# Patient Record
Sex: Female | Born: 1970 | Race: White | Hispanic: No | Smoking: Former smoker
Health system: Southern US, Community
[De-identification: ages and names within clinical notes are randomized; demographics above are authoritative.]

## PROBLEM LIST (undated history)

## (undated) DIAGNOSIS — R109 Unspecified abdominal pain: Secondary | ICD-10-CM

## (undated) DIAGNOSIS — N926 Irregular menstruation, unspecified: Secondary | ICD-10-CM

## (undated) DIAGNOSIS — R87629 Unspecified abnormal cytological findings in specimens from vagina: Secondary | ICD-10-CM

## (undated) DIAGNOSIS — M1990 Unspecified osteoarthritis, unspecified site: Secondary | ICD-10-CM

## (undated) DIAGNOSIS — F329 Major depressive disorder, single episode, unspecified: Secondary | ICD-10-CM

## (undated) DIAGNOSIS — M722 Plantar fascial fibromatosis: Secondary | ICD-10-CM

## (undated) DIAGNOSIS — G4482 Headache associated with sexual activity: Principal | ICD-10-CM

## (undated) DIAGNOSIS — F32A Depression, unspecified: Secondary | ICD-10-CM

## (undated) DIAGNOSIS — M719 Bursopathy, unspecified: Secondary | ICD-10-CM

## (undated) DIAGNOSIS — G43909 Migraine, unspecified, not intractable, without status migrainosus: Secondary | ICD-10-CM

## (undated) DIAGNOSIS — Z8659 Personal history of other mental and behavioral disorders: Secondary | ICD-10-CM

## (undated) DIAGNOSIS — J329 Chronic sinusitis, unspecified: Secondary | ICD-10-CM

## (undated) DIAGNOSIS — M549 Dorsalgia, unspecified: Secondary | ICD-10-CM

## (undated) HISTORY — DX: Irregular menstruation, unspecified: N92.6

## (undated) HISTORY — DX: Unspecified abdominal pain: R10.9

## (undated) HISTORY — DX: Bursopathy, unspecified: M71.9

## (undated) HISTORY — DX: Unspecified osteoarthritis, unspecified site: M19.90

## (undated) HISTORY — DX: Plantar fascial fibromatosis: M72.2

## (undated) HISTORY — DX: Personal history of other mental and behavioral disorders: Z86.59

## (undated) HISTORY — DX: Chronic sinusitis, unspecified: J32.9

## (undated) HISTORY — DX: Dorsalgia, unspecified: M54.9

## (undated) HISTORY — DX: Headache associated with sexual activity: G44.82

## (undated) HISTORY — PX: OTHER SURGICAL HISTORY: SHX169

## (undated) HISTORY — DX: Unspecified abnormal cytological findings in specimens from vagina: R87.629

---

## 2000-07-30 ENCOUNTER — Emergency Department (HOSPITAL_COMMUNITY): Admission: EM | Admit: 2000-07-30 | Discharge: 2000-07-30 | Payer: Self-pay | Admitting: *Deleted

## 2000-10-28 ENCOUNTER — Other Ambulatory Visit: Admission: RE | Admit: 2000-10-28 | Discharge: 2000-10-28 | Payer: Self-pay | Admitting: Obstetrics and Gynecology

## 2001-08-27 ENCOUNTER — Other Ambulatory Visit: Admission: RE | Admit: 2001-08-27 | Discharge: 2001-08-27 | Payer: Self-pay | Admitting: Dermatology

## 2008-03-25 ENCOUNTER — Emergency Department (HOSPITAL_COMMUNITY): Admission: EM | Admit: 2008-03-25 | Discharge: 2008-03-25 | Payer: Self-pay | Admitting: Emergency Medicine

## 2008-06-07 ENCOUNTER — Emergency Department (HOSPITAL_COMMUNITY): Admission: EM | Admit: 2008-06-07 | Discharge: 2008-06-07 | Payer: Self-pay | Admitting: Emergency Medicine

## 2008-11-14 ENCOUNTER — Encounter
Admission: RE | Admit: 2008-11-14 | Discharge: 2008-11-14 | Payer: Self-pay | Admitting: Physical Medicine & Rehabilitation

## 2008-12-12 ENCOUNTER — Other Ambulatory Visit: Admission: RE | Admit: 2008-12-12 | Discharge: 2008-12-12 | Payer: Self-pay | Admitting: Obstetrics & Gynecology

## 2009-08-14 ENCOUNTER — Emergency Department (HOSPITAL_COMMUNITY): Admission: EM | Admit: 2009-08-14 | Discharge: 2009-08-15 | Payer: Self-pay | Admitting: Emergency Medicine

## 2009-12-02 ENCOUNTER — Emergency Department (HOSPITAL_COMMUNITY): Admission: EM | Admit: 2009-12-02 | Discharge: 2009-12-02 | Payer: Self-pay | Admitting: Emergency Medicine

## 2010-02-28 ENCOUNTER — Other Ambulatory Visit
Admission: RE | Admit: 2010-02-28 | Discharge: 2010-02-28 | Payer: Self-pay | Source: Home / Self Care | Admitting: Obstetrics & Gynecology

## 2010-06-18 LAB — URINALYSIS, ROUTINE W REFLEX MICROSCOPIC
Bilirubin Urine: NEGATIVE
Glucose, UA: NEGATIVE mg/dL
Hgb urine dipstick: NEGATIVE
Specific Gravity, Urine: 1.015 (ref 1.005–1.030)
Urobilinogen, UA: 0.2 mg/dL (ref 0.0–1.0)

## 2010-06-18 LAB — DIFFERENTIAL
Basophils Relative: 0 % (ref 0–1)
Eosinophils Absolute: 0 10*3/uL (ref 0.0–0.7)
Lymphs Abs: 0.8 10*3/uL (ref 0.7–4.0)
Monocytes Absolute: 0.2 10*3/uL (ref 0.1–1.0)
Monocytes Relative: 5 % (ref 3–12)
Neutro Abs: 3.9 10*3/uL (ref 1.7–7.7)

## 2010-06-18 LAB — BASIC METABOLIC PANEL
CO2: 23 mEq/L (ref 19–32)
Chloride: 115 mEq/L — ABNORMAL HIGH (ref 96–112)
GFR calc Af Amer: >60 mL/min (ref >60)
Sodium: 143 mEq/L (ref 135–145)

## 2010-06-18 LAB — CBC
Hemoglobin: 12.8 g/dL (ref 12.0–15.0)
MCHC: 36.4 g/dL — ABNORMAL HIGH (ref 30.0–36.0)
MCV: 89.9 fL (ref 78.0–100.0)
RBC: 3.91 MIL/uL (ref 3.87–5.11)
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
## 2011-04-17 ENCOUNTER — Other Ambulatory Visit (HOSPITAL_COMMUNITY)
Admission: RE | Admit: 2011-04-17 | Discharge: 2011-04-17 | Disposition: A | Discharge status: Home / Self Care | Payer: 59 | Source: Ambulatory Visit | Attending: Obstetrics and Gynecology | Admitting: Obstetrics and Gynecology

## 2011-04-17 ENCOUNTER — Other Ambulatory Visit: Payer: Self-pay | Admitting: Adult Health

## 2011-04-17 DIAGNOSIS — Z01419 Encounter for gynecological examination (general) (routine) without abnormal findings: Secondary | ICD-10-CM | POA: Insufficient documentation

## 2011-04-17 DIAGNOSIS — IMO0002 Reserved for concepts with insufficient information to code with codable children: Secondary | ICD-10-CM

## 2011-04-17 DIAGNOSIS — Z113 Encounter for screening for infections with a predominantly sexual mode of transmission: Secondary | ICD-10-CM | POA: Insufficient documentation

## 2011-04-18 ENCOUNTER — Other Ambulatory Visit: Payer: Self-pay | Admitting: Adult Health

## 2011-04-24 ENCOUNTER — Ambulatory Visit (HOSPITAL_COMMUNITY)
Admission: RE | Admit: 2011-04-24 | Discharge: 2011-04-24 | Disposition: A | Discharge status: Home / Self Care | Payer: 59 | Source: Ambulatory Visit | Attending: Adult Health | Admitting: Adult Health

## 2011-04-24 ENCOUNTER — Other Ambulatory Visit (HOSPITAL_COMMUNITY): Payer: Self-pay | Admitting: Adult Health

## 2011-04-24 DIAGNOSIS — IMO0002 Reserved for concepts with insufficient information to code with codable children: Secondary | ICD-10-CM

## 2011-04-24 DIAGNOSIS — N63 Unspecified lump in unspecified breast: Secondary | ICD-10-CM | POA: Insufficient documentation

## 2012-02-29 ENCOUNTER — Encounter (HOSPITAL_COMMUNITY): Payer: Self-pay | Admitting: Emergency Medicine

## 2012-02-29 ENCOUNTER — Emergency Department (HOSPITAL_COMMUNITY)
Admission: EM | Admit: 2012-02-29 | Discharge: 2012-02-29 | Disposition: A | Discharge status: Home / Self Care | Payer: 59 | Attending: Emergency Medicine | Admitting: Emergency Medicine

## 2012-02-29 DIAGNOSIS — G43909 Migraine, unspecified, not intractable, without status migrainosus: Secondary | ICD-10-CM | POA: Insufficient documentation

## 2012-02-29 DIAGNOSIS — R51 Headache: Secondary | ICD-10-CM

## 2012-02-29 DIAGNOSIS — F329 Major depressive disorder, single episode, unspecified: Secondary | ICD-10-CM | POA: Insufficient documentation

## 2012-02-29 DIAGNOSIS — F3289 Other specified depressive episodes: Secondary | ICD-10-CM | POA: Insufficient documentation

## 2012-02-29 HISTORY — DX: Depression, unspecified: F32.A

## 2012-02-29 HISTORY — DX: Migraine, unspecified, not intractable, without status migrainosus: G43.909

## 2012-02-29 HISTORY — DX: Major depressive disorder, single episode, unspecified: F32.9

## 2012-02-29 MED ORDER — METOCLOPRAMIDE HCL 5 MG/ML IJ SOLN
10.0000 mg | Freq: Once | INTRAMUSCULAR | Status: AC
  Administered 2012-02-29: 10 mg via INTRAMUSCULAR
  Filled 2012-02-29: qty 2

## 2012-02-29 MED ORDER — ONDANSETRON 4 MG PO TBDP: 4.0000 mg | ORAL_TABLET | Freq: Three times a day (TID) | ORAL | Status: DC | PRN

## 2012-02-29 MED ORDER — PROMETHAZINE HCL 25 MG/ML IJ SOLN
25.0000 mg | Freq: Once | INTRAMUSCULAR | Status: DC
  Filled 2012-02-29: qty 1

## 2012-02-29 MED ORDER — KETOROLAC TROMETHAMINE 60 MG/2ML IM SOLN
60.0000 mg | Freq: Once | INTRAMUSCULAR | Status: AC
  Administered 2012-02-29: 60 mg via INTRAMUSCULAR
  Filled 2012-02-29: qty 2

## 2012-02-29 MED ORDER — OXYCODONE-ACETAMINOPHEN 5-325 MG PO TABS: 1.0000 | ORAL_TABLET | ORAL | Status: DC | PRN

## 2012-02-29 MED ORDER — NAPROXEN 500 MG PO TABS: 500.0000 mg | ORAL_TABLET | Freq: Two times a day (BID) | ORAL | Status: DC

## 2012-02-29 MED ORDER — ONDANSETRON 8 MG PO TBDP
8.0000 mg | ORAL_TABLET | Freq: Once | ORAL | Status: AC
  Administered 2012-02-29: 8 mg via ORAL
  Filled 2012-02-29: qty 1

## 2012-02-29 NOTE — ED Notes (Signed)
Pt complains of migraine headache x3 hours, also complains of nausea and vomiting. States she has had 2 8mg  zofran tablets over the last 4 hours. Notes a history of migraines and states they are hormonal. Pt aax4 at this time.

## 2012-02-29 NOTE — ED Provider Notes (Signed)
History     CSN: [id number]  Arrival date & time 02/29/12  4098   First MD Initiated Contact with Patient 02/29/12 [phone]      Chief Complaint  Patient presents with  . Migraine    (Consider location/radiation/quality/duration/timing/severity/associated sxs/prior treatment) HPI Comments: 41 year old female with a history of migraine disorder, she has been formally diagnosed with this and is currently treated by her doctor, Dr. Margo Aye with Valium and ibuprofen which she states usually works for her migraines. Approximately 3 hours prior to arrival the patient developed left-sided throbbing headache which radiates over her left ear down to her left neck. This is similar to her prior headaches, she has associated nausea and vomiting similar to her prior headaches but denies any fevers, chills, cough, shortness of breath, chest pain, diarrhea, rashes, sore throat. She has had no improvement with the medications prior to arrival.  Patient is a 41 y.o. female presenting with migraines. The history is provided by the patient.  Migraine    Past Medical History  Diagnosis Date  . Migraine   . Depressed     History reviewed. No pertinent past surgical history.  History reviewed. No pertinent family history.  History  Substance Use Topics  . Smoking status: Never Smoker   . Smokeless tobacco: Not on file  . Alcohol Use: Yes     Comment: states occasional use    OB History    Grav Para Term Preterm Abortions TAB SAB Ect Mult Living                  Review of Systems  All other systems reviewed and are negative.    Allergies  Erythromycin  Home Medications   Current Outpatient Rx  Name  Route  Sig  Dispense  Refill  . ONDANSETRON HCL 4 MG PO TABS   Oral   Take 4 mg by mouth every 8 (eight) hours as needed.         Marland Kitchen NAPROXEN 500 MG PO TABS   Oral   Take 1 tablet (500 mg total) by mouth 2 (two) times daily with a meal.   30 tablet   0   . ONDANSETRON 4 MG PO  TBDP   Oral   Take 1 tablet (4 mg total) by mouth every 8 (eight) hours as needed for nausea.   10 tablet   0   . OXYCODONE-ACETAMINOPHEN 5-325 MG PO TABS   Oral   Take 1 tablet by mouth every 4 (four) hours as needed for pain.   20 tablet   0     BP 110/66  Pulse 80  Temp 98.6 F (37 C) (Oral)  Resp 20  Ht 5\' 3"  (1.6 m)  Wt 104 lb (47.174 kg)  BMI 18.42 kg/m2  SpO2 98%  LMP 02/27/2012  Physical Exam  Nursing note and vitals reviewed. Constitutional: She appears well-developed and well-nourished.       Uncomfortable appearing  HENT:  Head: Normocephalic and atraumatic.  Mouth/Throat: Oropharynx is clear and moist. No oropharyngeal exudate.  Eyes: Conjunctivae normal and EOM are normal. Pupils are equal, round, and reactive to light. Right eye exhibits no discharge. Left eye exhibits no discharge. No scleral icterus.  Neck: Normal range of motion. Neck supple. No JVD present. No thyromegaly present.  Cardiovascular: Normal rate, regular rhythm, normal heart sounds and intact distal pulses.  Exam reveals no gallop and no friction rub.   No murmur heard. Pulmonary/Chest: Effort normal and breath sounds normal.  No respiratory distress. She has no wheezes. She has no rales.  Abdominal: Soft. Bowel sounds are normal. She exhibits no distension and no mass. There is no tenderness.  Musculoskeletal: Normal range of motion. She exhibits no edema and no tenderness.  Lymphadenopathy:    She has no cervical adenopathy.  Neurological: She is alert. Coordination normal.       Normal speech, normal coordination of all 4 extremities, normal strength at the bilateral hands and grips, normal cranial nerves III through XII  Skin: Skin is warm and dry. No rash noted. No erythema.  Psychiatric: She has a normal mood and affect. Her behavior is normal.    ED Course  Procedures (including critical care time)  Labs Reviewed - No data to display No results found.   1. Headache        MDM  Overall the patient appears, she has normal vital signs, I will give intramuscular Toradol and metoclopramide, reevaluate.  Patient states that her headache is much better, still has mild nausea, Zofran given. The patient refused Phenergan.      Vida Roller, MD 02/29/12 [phone]

## 2012-02-29 NOTE — ED Notes (Signed)
Pt notes relief of headache but states she is still having nausea

## 2013-01-30 ENCOUNTER — Encounter (HOSPITAL_COMMUNITY): Payer: Self-pay | Admitting: Emergency Medicine

## 2013-01-30 ENCOUNTER — Emergency Department (HOSPITAL_COMMUNITY)
Admission: EM | Admit: 2013-01-30 | Discharge: 2013-01-30 | Disposition: A | Discharge status: Home / Self Care | Payer: BC Managed Care – PPO | Attending: Emergency Medicine | Admitting: Emergency Medicine

## 2013-01-30 DIAGNOSIS — F329 Major depressive disorder, single episode, unspecified: Secondary | ICD-10-CM | POA: Insufficient documentation

## 2013-01-30 DIAGNOSIS — Z79899 Other long term (current) drug therapy: Secondary | ICD-10-CM | POA: Insufficient documentation

## 2013-01-30 DIAGNOSIS — R519 Headache, unspecified: Secondary | ICD-10-CM

## 2013-01-30 DIAGNOSIS — Z881 Allergy status to other antibiotic agents status: Secondary | ICD-10-CM | POA: Insufficient documentation

## 2013-01-30 DIAGNOSIS — F3289 Other specified depressive episodes: Secondary | ICD-10-CM | POA: Insufficient documentation

## 2013-01-30 DIAGNOSIS — R110 Nausea: Secondary | ICD-10-CM | POA: Insufficient documentation

## 2013-01-30 DIAGNOSIS — Z8669 Personal history of other diseases of the nervous system and sense organs: Secondary | ICD-10-CM | POA: Insufficient documentation

## 2013-01-30 DIAGNOSIS — H53149 Visual discomfort, unspecified: Secondary | ICD-10-CM | POA: Insufficient documentation

## 2013-01-30 DIAGNOSIS — R51 Headache: Secondary | ICD-10-CM | POA: Insufficient documentation

## 2013-01-30 MED ORDER — DIPHENHYDRAMINE HCL 50 MG/ML IJ SOLN
25.0000 mg | Freq: Once | INTRAMUSCULAR | Status: DC
  Filled 2013-01-30: qty 1

## 2013-01-30 MED ORDER — SODIUM CHLORIDE 0.9 % IV BOLUS (SEPSIS)
1000.0000 mL | Freq: Once | INTRAVENOUS | Status: AC
  Administered 2013-01-30: 1000 mL via INTRAVENOUS

## 2013-01-30 MED ORDER — KETOROLAC TROMETHAMINE 30 MG/ML IJ SOLN
30.0000 mg | Freq: Once | INTRAMUSCULAR | Status: DC
  Filled 2013-01-30: qty 1

## 2013-01-30 MED ORDER — KETOROLAC TROMETHAMINE 15 MG/ML IJ SOLN
15.0000 mg | Freq: Once | INTRAMUSCULAR | Status: AC
  Administered 2013-01-30: 15 mg via INTRAVENOUS
  Filled 2013-01-30: qty 1

## 2013-01-30 MED ORDER — METOCLOPRAMIDE HCL 5 MG/ML IJ SOLN
10.0000 mg | Freq: Once | INTRAMUSCULAR | Status: AC
  Administered 2013-01-30: 10 mg via INTRAVENOUS
  Filled 2013-01-30: qty 2

## 2013-01-30 NOTE — ED Notes (Signed)
Patient discharged to home with family. NAD.  

## 2013-01-30 NOTE — ED Provider Notes (Signed)
CSN: [id number]     Arrival date & time 01/30/13  0503 History   First MD Initiated Contact with Patient 01/30/13 0537     Chief Complaint  Patient presents with  . Migraine   (Consider location/radiation/quality/duration/timing/severity/associated sxs/prior Treatment) HPI Comments: Patient presents to the ED with a chief complaint of headache.  She states that the headache started last night and has progressively worsened.  She has a history of migraines.  She states that they are typically hormonal.  She states that she is due to start her period.  She states that the pain worsened this morning.  She is normally able to control the headaches with tylenol, ibuprofen, valium, and zofran, but this morning they have not helped.  She endorses associated nausea, photophobia, and phonophobia.  She denies fevers, chills, or ataxia.  The history is provided by the patient. No language interpreter was used.    Past Medical History  Diagnosis Date  . Migraine   . Depressed    History reviewed. No pertinent past surgical history. No family history on file. History  Substance Use Topics  . Smoking status: Never Smoker   . Smokeless tobacco: Not on file  . Alcohol Use: Yes     Comment: states occasional use   OB History   Grav Para Term Preterm Abortions TAB SAB Ect Mult Living                 Review of Systems  All other systems reviewed and are negative.    Allergies  Erythromycin  Home Medications   Current Outpatient Rx  Name  Route  Sig  Dispense  Refill  . cefdinir (OMNICEF) 300 MG capsule   Oral   Take 300 mg by mouth 2 (two) times daily.         . diazepam (VALIUM) 5 MG tablet   Oral   Take 5 mg by mouth every 6 (six) hours as needed (pain due to muscle spasms).         . fluconazole (DIFLUCAN) 200 MG tablet   Oral   Take 200 mg by mouth once a week. For 2 doses only         . FLUoxetine (PROZAC) 10 MG capsule   Oral   Take 10 mg by mouth daily.          . Lactobacillus (ACIDOPHILUS PO)   Oral   Take 1 capsule by mouth daily.         . Multiple Vitamin (MULTIVITAMIN WITH MINERALS) TABS tablet   Oral   Take 1 tablet by mouth daily.         . ondansetron (ZOFRAN) 8 MG tablet   Oral   Take 8 mg by mouth every 8 (eight) hours as needed for nausea.          BP 114/83  Pulse 82  Temp(Src) 98.2 F (36.8 C) (Oral)  Resp 18  SpO2 100%  LMP 01/03/2013 Physical Exam  Nursing note and vitals reviewed. Constitutional: She is oriented to person, place, and time. She appears well-developed and well-nourished.  HENT:  Head: Normocephalic and atraumatic.  Right Ear: External ear normal.  Left Ear: External ear normal.  No increased pain with chewing.  Eyes: Conjunctivae and EOM are normal. Pupils are equal, round, and reactive to light.  Neck: Normal range of motion. Neck supple.  No pain with neck flexion, no meningismus  Cardiovascular: Normal rate, regular rhythm and normal heart sounds.  Exam reveals  no gallop and no friction rub.   No murmur heard. Pulmonary/Chest: Effort normal and breath sounds normal. No respiratory distress. She has no wheezes. She has no rales. She exhibits no tenderness.  Abdominal: Soft. Bowel sounds are normal. She exhibits no distension and no mass. There is no tenderness. There is no rebound and no guarding.  Musculoskeletal: Normal range of motion. She exhibits no edema and no tenderness.  Normal gait.  Neurological: She is alert and oriented to person, place, and time. She has normal reflexes.  CN 3-12 intact, no pronator drift, normal shin to heel, normal RAM, sensation and strength intact bilaterally.  Skin: Skin is warm and dry.  Psychiatric: She has a normal mood and affect. Her behavior is normal. Judgment and thought content normal.    ED Course  Procedures (including critical care time) Labs Review Labs Reviewed - No data to display Imaging Review No results found.  EKG Interpretation    None       MDM   1. Headache     Patient with headache.  States that this feels like her typical headache.  She has not had any fevers or neurovascular deficits.  Headache started last night and worsened this morning.  Not thunderclap in onset.  Progressively worsening.  Will give migraine cocktail and re-evaluate.  Patient asks for low-dose cocktail.  Patient feels significantly improved.  She is requesting to go home.  Patient is stable and ready for discharge.  Return precautions are given.    Roxy Horseman, PA-C 01/30/13 [phone]

## 2013-01-30 NOTE — ED Notes (Addendum)
Migraine: occipital region. Light sensitive. [id number]: took 8 mg odt and 5 mg valium with no relief. Did take ibuprofen throughout the day with no relief.

## 2013-01-30 NOTE — ED Provider Notes (Signed)
Medical screening examination/treatment/procedure(s) were performed by non-physician practitioner and as supervising physician I was immediately available for consultation/collaboration.   Nyana Haren, MD 01/30/13 2300 

## 2013-04-02 ENCOUNTER — Other Ambulatory Visit (HOSPITAL_COMMUNITY): Payer: Self-pay | Admitting: Internal Medicine

## 2013-04-02 DIAGNOSIS — K829 Disease of gallbladder, unspecified: Secondary | ICD-10-CM

## 2013-04-05 ENCOUNTER — Ambulatory Visit (HOSPITAL_COMMUNITY): Payer: BC Managed Care – PPO

## 2013-04-05 ENCOUNTER — Ambulatory Visit (HOSPITAL_COMMUNITY)
Admission: RE | Admit: 2013-04-05 | Discharge: 2013-04-05 | Disposition: A | Discharge status: Home / Self Care | Payer: BC Managed Care – PPO | Source: Ambulatory Visit | Attending: Internal Medicine | Admitting: Internal Medicine

## 2013-04-05 DIAGNOSIS — K829 Disease of gallbladder, unspecified: Secondary | ICD-10-CM

## 2013-04-05 DIAGNOSIS — R1011 Right upper quadrant pain: Secondary | ICD-10-CM | POA: Insufficient documentation

## 2013-09-27 ENCOUNTER — Other Ambulatory Visit: Payer: Self-pay | Admitting: Physical Medicine and Rehabilitation

## 2013-09-27 DIAGNOSIS — IMO0002 Reserved for concepts with insufficient information to code with codable children: Secondary | ICD-10-CM

## 2013-09-27 DIAGNOSIS — M5126 Other intervertebral disc displacement, lumbar region: Secondary | ICD-10-CM

## 2013-10-05 ENCOUNTER — Ambulatory Visit
Admission: RE | Admit: 2013-10-05 | Discharge: 2013-10-05 | Disposition: A | Discharge status: Home / Self Care | Payer: BC Managed Care – PPO | Source: Ambulatory Visit | Attending: Physical Medicine and Rehabilitation | Admitting: Physical Medicine and Rehabilitation

## 2013-10-05 DIAGNOSIS — IMO0002 Reserved for concepts with insufficient information to code with codable children: Secondary | ICD-10-CM

## 2013-10-05 DIAGNOSIS — M5126 Other intervertebral disc displacement, lumbar region: Secondary | ICD-10-CM

## 2013-11-19 ENCOUNTER — Ambulatory Visit (INDEPENDENT_AMBULATORY_CARE_PROVIDER_SITE_OTHER): Payer: Self-pay

## 2013-11-19 ENCOUNTER — Ambulatory Visit (INDEPENDENT_AMBULATORY_CARE_PROVIDER_SITE_OTHER): Payer: BC Managed Care – PPO | Admitting: Neurology

## 2013-11-19 DIAGNOSIS — M545 Low back pain, unspecified: Secondary | ICD-10-CM

## 2013-11-19 DIAGNOSIS — Z0289 Encounter for other administrative examinations: Secondary | ICD-10-CM

## 2013-11-19 DIAGNOSIS — R209 Unspecified disturbances of skin sensation: Secondary | ICD-10-CM

## 2013-11-19 NOTE — Procedures (Signed)
   NCS (NERVE CONDUCTION STUDY) WITH EMG (ELECTROMYOGRAPHY) REPORT   STUDY DATE: August 20 first 2015 PATIENT NAME: [place] Rybicki DOB: 15-Feb-1971 MRN: [id number]    TECHNOLOGIST: Gearldine ShownLorraine Jones ELECTROMYOGRAPHER: Levert FeinsteinYan, Ceaser Ebeling M.D.  CLINICAL INFORMATION:  43 years old female, fell down steps, complains of left low back pain, lower extremity paresthesia  On examination: Bilateral lower extremity motor strength is normal, sensory was intact to light touch, deep tendon reflexes of both upper and lower extremities were brisk and symmetric. Plantar responses were flexor   FINDINGS: NERVE CONDUCTION STUDY: Bilateral peroneal sensory responses were normal. Bilateral peroneal, tibial motor responses were normal. Bilateral H. reflexes were normal and symmetric.  NEEDLE ELECTROMYOGRAPHY: Selected needle examination was performed at left lower extremity muscles, and left lumbosacral paraspinal muscles.  Needle examination of left tibialis anterior, tibialis posterior, peroneal longus, vastus lateralis, biceps femoris short head was normal  There was no spontaneous activity at left lumbosacral paraspinal muscles, left L4 L5-S1  IMPRESSION:  This is a normal study. There is no electrodiagnostic evidence of large fiber peripheral neuropathy, or left lumbar radiculopathy.  INTERPRETING PHYSICIAN:   Levert FeinsteinYan, Cresencia Asmus M.D. Ph.D. [facility] [address], [address] [place], [place] [address] [phone]

## 2013-12-15 ENCOUNTER — Other Ambulatory Visit: Payer: BC Managed Care – PPO | Admitting: Adult Health

## 2013-12-22 ENCOUNTER — Ambulatory Visit (INDEPENDENT_AMBULATORY_CARE_PROVIDER_SITE_OTHER): Payer: BC Managed Care – PPO | Admitting: Adult Health

## 2013-12-22 ENCOUNTER — Encounter: Payer: Self-pay | Admitting: Adult Health

## 2013-12-22 ENCOUNTER — Other Ambulatory Visit (HOSPITAL_COMMUNITY)
Admission: RE | Admit: 2013-12-22 | Discharge: 2013-12-22 | Disposition: A | Discharge status: Home / Self Care | Payer: BC Managed Care – PPO | Source: Ambulatory Visit | Attending: Adult Health | Admitting: Adult Health

## 2013-12-22 VITALS — BP 104/80 | HR 76 | Ht 63.0 in | Wt 105.5 lb

## 2013-12-22 DIAGNOSIS — Z01419 Encounter for gynecological examination (general) (routine) without abnormal findings: Secondary | ICD-10-CM | POA: Diagnosis not present

## 2013-12-22 DIAGNOSIS — M545 Low back pain: Secondary | ICD-10-CM

## 2013-12-22 DIAGNOSIS — Z1151 Encounter for screening for human papillomavirus (HPV): Secondary | ICD-10-CM | POA: Diagnosis present

## 2013-12-22 DIAGNOSIS — M549 Dorsalgia, unspecified: Secondary | ICD-10-CM | POA: Insufficient documentation

## 2013-12-22 NOTE — Patient Instructions (Signed)
Physical in 1 year Mammogram yearly  

## 2013-12-22 NOTE — Progress Notes (Signed)
Patient ID: Carly Baker, female   DOB: 07/14/1970, 43 y.o.   MRN: [id number] History of Present Illness: Carly is a 43 year old white female in for a pap and physical.She noticed a discharge last week but it has cleared up and she has irregular periods at times with BTB that has occurred for years.She fell 07/25/13 and has had pain in low left back since and has seen Dr Ladona Ridgel and Dr Eduard Clos and Dr Cleophas Dunker and is going to [facility] next.Has had MRI and injections and TENS unit and still has pain and pain down leg.   Current Medications, Allergies, Past Medical History, Past Surgical History, Family History and Social History were reviewed in [employer] record.     Review of Systems: Patient denies any headaches, blurred vision, shortness of breath, chest pain, abdominal pain, problems with urination, or intercourse. No joint pain or mood swings,using miralax for OIC, see HPI.    Physical Exam:BP 104/80  Pulse 76  Ht  (1.6 m)  Wt 105 lb 8 oz (47.854 kg)  BMI 18.69 kg/m2  LMP 12/15/2013 General:  Well developed, well nourished, no acute distress Skin:  Warm and dry Neck:  Midline trachea, normal thyroid Lungs; Clear to auscultation bilaterally Breast:  No dominant palpable mass, retraction, or nipple discharge Cardiovascular: Regular rate and rhythm Abdomen:  Soft, non tender, no hepatosplenomegaly Pelvic:  External genitalia is normal in appearance.  The vagina is normal in appearance.   The cervix is smooth friable with EC brush, pap with HPV performed.  Uterus is felt to be normal size, shape, and contour.  No  adnexal masses or tenderness noted. Rectal: Good sphincter tone, no polyps, or hemorrhoids felt.  Hemoccult negative. Extremities:  No swelling or varicosities noted Psych:  No mood changes, alert and cooperative seems happy but concerned   Impression: Yearly gyn exam  Low back pain with sciatica     Plan: Physical in 1 year Mammogram now and  yearly Keep appt at [facility]

## 2013-12-24 ENCOUNTER — Telehealth: Payer: Self-pay | Admitting: Adult Health

## 2013-12-24 LAB — CYTOLOGY - PAP

## 2013-12-24 MED ORDER — NITROFURANTOIN MONOHYD MACRO 100 MG PO CAPS: 100.0000 mg | ORAL_CAPSULE | Freq: Two times a day (BID) | ORAL | Status: DC

## 2013-12-24 NOTE — Telephone Encounter (Signed)
Called comes of frequency and burning with urination started yesterday taking AZO can't come in today, will rx Macrobid and push fluids if not better by Monday call.

## 2013-12-27 ENCOUNTER — Telehealth: Payer: Self-pay | Admitting: Adult Health

## 2013-12-27 NOTE — Telephone Encounter (Signed)
Try taking with food or milk make appt with PCP to recheck urine

## 2014-01-31 ENCOUNTER — Encounter: Payer: Self-pay | Admitting: Adult Health

## 2014-11-07 ENCOUNTER — Ambulatory Visit (INDEPENDENT_AMBULATORY_CARE_PROVIDER_SITE_OTHER): Payer: BLUE CROSS/BLUE SHIELD | Admitting: Adult Health

## 2014-11-07 ENCOUNTER — Encounter: Payer: Self-pay | Admitting: Adult Health

## 2014-11-07 VITALS — BP 120/80 | HR 72 | Ht 63.0 in | Wt 120.0 lb

## 2014-11-07 DIAGNOSIS — R109 Unspecified abdominal pain: Secondary | ICD-10-CM | POA: Diagnosis not present

## 2014-11-07 DIAGNOSIS — J329 Chronic sinusitis, unspecified: Secondary | ICD-10-CM

## 2014-11-07 DIAGNOSIS — N926 Irregular menstruation, unspecified: Secondary | ICD-10-CM | POA: Diagnosis not present

## 2014-11-07 DIAGNOSIS — Z8659 Personal history of other mental and behavioral disorders: Secondary | ICD-10-CM

## 2014-11-07 DIAGNOSIS — J0121 Acute recurrent ethmoidal sinusitis: Secondary | ICD-10-CM

## 2014-11-07 DIAGNOSIS — Z3202 Encounter for pregnancy test, result negative: Secondary | ICD-10-CM | POA: Diagnosis not present

## 2014-11-07 DIAGNOSIS — G4482 Headache associated with sexual activity: Secondary | ICD-10-CM

## 2014-11-07 HISTORY — DX: Unspecified abdominal pain: R10.9

## 2014-11-07 HISTORY — DX: Personal history of other mental and behavioral disorders: Z86.59

## 2014-11-07 HISTORY — DX: Headache associated with sexual activity: G44.82

## 2014-11-07 HISTORY — DX: Irregular menstruation, unspecified: N92.6

## 2014-11-07 HISTORY — DX: Chronic sinusitis, unspecified: J32.9

## 2014-11-07 LAB — POCT URINALYSIS DIPSTICK
Glucose, UA: NEGATIVE
LEUKOCYTES UA: NEGATIVE
NITRITE UA: NEGATIVE
Protein, UA: NEGATIVE
RBC UA: NEGATIVE

## 2014-11-07 LAB — POCT URINE PREGNANCY: Preg Test, Ur: NEGATIVE

## 2014-11-07 MED ORDER — FLUCONAZOLE 150 MG PO TABS: 150.0000 mg | ORAL_TABLET | Freq: Once | ORAL | Status: DC

## 2014-11-07 MED ORDER — CEPHALEXIN 500 MG PO CAPS: 500.0000 mg | ORAL_CAPSULE | Freq: Four times a day (QID) | ORAL | Status: DC

## 2014-11-07 NOTE — Addendum Note (Signed)
Addended by: Cyril Mourning A on: 11/07/2014 03:53 PM   Modules accepted: Orders

## 2014-11-07 NOTE — Addendum Note (Signed)
Addended by: Cyril Mourning A on: 11/07/2014 01:02 PM   Modules accepted: Orders

## 2014-11-07 NOTE — Patient Instructions (Addendum)
Get MRI 8/18 at 4:45 at [facility] Take keflex See me 8/19 in f/u

## 2014-11-07 NOTE — Progress Notes (Addendum)
Subjective:     Patient ID: Carly Baker, female   DOB: Jun 27, 1970, 44 y.o.   MRN: [id number]  HPI [place] is a 44 year old white female in complaining of having missed 3 periods and ?sinus infection and has headache with orgasm for about 6 months(has happened about 3 times), and stomach cramps after sex.She has had hot flashes and moodiness at times and has panic attacks.She has some dizziness at times and blurred vision occasionally.  Review of Systems Patient denies any  hearing loss, fatigue, blurred vision, shortness of breath, chest pain, problems with bowel movements, urination.  No joint pain, see HPI for positives.  Reviewed past medical,surgical, social and family history. Reviewed medications and allergies.     Objective:   Physical Exam BP 120/80 mmHg  Pulse 72  Ht  (1.6 m)  Wt 120 lb (54.432 kg)  BMI 21.26 kg/m2 UPT negative,urine dipstick negative, Skin warm and dry, has tenderness in ethmoid sinus esp on left, no swollen lymph nodes and CN 2-12 intact, negative Romberg and can walk heel to toe. Pelvic: external genitalia is normal in appearance no lesions, vagina: scant discharge without odor,urethra has no lesions or masses noted, cervix:everted  and bulbous, uterus: normal size, shape and contour, non tender, no masses felt, adnexa: no masses or tenderness noted. Bladder is non tender and no masses felt.   She has valium for panic attacks, will treat sinus infection and get labs and will get MRI and MRA to assess headaches with orgasms and to rule out a hemorrhage or aneurysm.Pt agrees.  Assessment:     Headache with orgasm Missed periods Ethmoid sinus infection Abdominal cramps History of panic attacks    Plan:    Scheduled MRI of brain wo contrast and MRA wo contrast at [facility] 8/18 at 4:45 pm, Angie to precert Check CBC,CMP,TSH and [facility] Rx keflex 500 mg 1 qid x 7 days  Rx diflucan 150 mg #1 with 1 refill Follow up 8/19 with me to go over MRI/MRA and labs  May get gyn [place] if cramps continue   Insurance would not precert MRI of brain but would precert MRA, so will get MRA first, precert # [id number].

## 2014-11-08 ENCOUNTER — Telehealth: Payer: Self-pay | Admitting: Adult Health

## 2014-11-08 LAB — CBC
Hematocrit: 46.6 % (ref 34.0–46.6)
Hemoglobin: 16.1 g/dL — ABNORMAL HIGH (ref 11.1–15.9)
MCH: 29.6 pg (ref 26.6–33.0)
MCHC: 34.5 g/dL (ref 31.5–35.7)
MCV: 86 fL (ref 79–97)
Platelets: 235 10*3/uL (ref 150–379)
RBC: 5.44 x10E6/uL — ABNORMAL HIGH (ref 3.77–5.28)
RDW: 14.2 % (ref 12.3–15.4)
WBC: 8 10*3/uL (ref 3.4–10.8)

## 2014-11-08 LAB — COMPREHENSIVE METABOLIC PANEL
ALK PHOS: 61 IU/L (ref 39–117)
ALT: 24 IU/L (ref 0–32)
AST: 24 IU/L (ref 0–40)
Albumin/Globulin Ratio: 1.8 (ref 1.1–2.5)
Albumin: 4.9 g/dL (ref 3.5–5.5)
BUN / CREAT RATIO: 8 — AB (ref 9–23)
BUN: 8 mg/dL (ref 6–24)
Bilirubin Total: 0.4 mg/dL (ref 0.0–1.2)
CALCIUM: 10.1 mg/dL (ref 8.7–10.2)
CO2: 23 mmol/L (ref 18–29)
Chloride: 97 mmol/L (ref 97–108)
Creatinine, Ser: 1.02 mg/dL — ABNORMAL HIGH (ref 0.57–1.00)
GFR, EST AFRICAN AMERICAN: 77 mL/min/{1.73_m2} (ref >59)
GFR, EST NON AFRICAN AMERICAN: 67 mL/min/{1.73_m2} (ref >59)
GLOBULIN, TOTAL: 2.8 g/dL (ref 1.5–4.5)
GLUCOSE: 89 mg/dL (ref 65–99)
Potassium: 4.3 mmol/L (ref 3.5–5.2)
Sodium: 139 mmol/L (ref 134–144)
TOTAL PROTEIN: 7.7 g/dL (ref 6.0–8.5)

## 2014-11-08 LAB — FOLLICLE STIMULATING HORMONE: FSH: 8.9 m[IU]/mL

## 2014-11-08 LAB — TSH: TSH: 0.958 u[IU]/mL (ref 0.450–4.500)

## 2014-11-08 NOTE — Telephone Encounter (Signed)
Had question about no period, told her that could wait will address again at visit 8/19

## 2014-11-08 NOTE — Telephone Encounter (Signed)
Pt aware of labs  

## 2014-11-17 ENCOUNTER — Ambulatory Visit (HOSPITAL_COMMUNITY): Payer: BLUE CROSS/BLUE SHIELD

## 2014-11-17 ENCOUNTER — Other Ambulatory Visit (HOSPITAL_COMMUNITY): Payer: Self-pay

## 2014-11-18 ENCOUNTER — Ambulatory Visit: Payer: BLUE CROSS/BLUE SHIELD | Admitting: Adult Health

## 2014-11-23 ENCOUNTER — Telehealth: Payer: Self-pay | Admitting: Adult Health

## 2014-11-23 NOTE — Telephone Encounter (Signed)
Called pt about MRA she did not go get 8/18, due to money issues, she said they wanted $725 up front,she will try to get when has the money, she still has not had a period, will come in 8/30 for appt to start period

## 2014-11-29 ENCOUNTER — Encounter: Payer: Self-pay | Admitting: Adult Health

## 2014-11-29 ENCOUNTER — Ambulatory Visit (INDEPENDENT_AMBULATORY_CARE_PROVIDER_SITE_OTHER): Payer: BLUE CROSS/BLUE SHIELD | Admitting: Adult Health

## 2014-11-29 VITALS — BP 120/70 | HR 96 | Ht 63.0 in | Wt 120.0 lb

## 2014-11-29 DIAGNOSIS — N926 Irregular menstruation, unspecified: Secondary | ICD-10-CM | POA: Diagnosis not present

## 2014-11-29 DIAGNOSIS — Z3202 Encounter for pregnancy test, result negative: Secondary | ICD-10-CM | POA: Diagnosis not present

## 2014-11-29 DIAGNOSIS — N912 Amenorrhea, unspecified: Secondary | ICD-10-CM

## 2014-11-29 LAB — POCT URINE PREGNANCY: PREG TEST UR: NEGATIVE

## 2014-11-29 MED ORDER — MEDROXYPROGESTERONE ACETATE 10 MG PO TABS: 10.0000 mg | ORAL_TABLET | Freq: Every day | ORAL | Status: DC

## 2014-11-29 NOTE — Progress Notes (Signed)
Subjective:     Patient ID: Carly Baker, female   DOB: 1970-08-26, 44 y.o.   MRN: [id number]  HPI Carly is a 44 year old white female, has not had period since April.She is working now and is happy.Did not get MRA for headache with orgasm, due to costs,she says she will be can not afford it now.  Review of Systems  No period since April, Patient denies any hearing loss, fatigue, blurred vision, shortness of breath, chest pain, abdominal pain, problems with bowel movements, urination, or intercourse. No joint pain or mood swings.see HPI. Reviewed past medical,surgical, social and family history. Reviewed medications and allergies.     Objective:   Physical Exam BP 120/70 mmHg  Pulse 96  Ht  (1.6 m)  Wt 120 lb (54.432 kg)  BMI 21.26 kg/m2  LMP 07/10/2014 UPT negative, head is better since keflex, did not get MRA due to cost, will try provera to see if can start a period.face time 10 minutes, with 50 % counseling.If has cramps with bleeding, call but she says she has some tramadol.    Assessment:    Missed periods    Plan:     Rx provera 10 mg #10 take 1 daily for 10 days Call me with period

## 2014-11-29 NOTE — Patient Instructions (Signed)
Call with period  Take provera 10 mg 1 daily for 10 days

## 2014-12-14 ENCOUNTER — Telehealth: Payer: Self-pay | Admitting: Adult Health

## 2014-12-14 NOTE — Telephone Encounter (Signed)
Took last provera Friday and has still not started period yet, give it a few more days

## 2014-12-20 ENCOUNTER — Telehealth: Payer: Self-pay | Admitting: Adult Health

## 2014-12-20 NOTE — Telephone Encounter (Signed)
Spoke with pt. Pt states she has not started period. She took the last Provera on 12/09/14. Please advise. Thanks!! JSY

## 2014-12-21 MED ORDER — MEDROXYPROGESTERONE ACETATE 10 MG PO TABS: ORAL_TABLET | ORAL | Status: DC

## 2014-12-21 NOTE — Addendum Note (Signed)
Addended by: Cyril Mourning A on: 12/21/2014 04:00 PM   Modules accepted: Orders

## 2014-12-21 NOTE — Telephone Encounter (Signed)
Will cycle with provera 10 mg for 10  Days every 3 months, discussed with Dr Despina Hidden

## 2014-12-21 NOTE — Telephone Encounter (Signed)
Left message to call.

## 2016-02-05 DIAGNOSIS — Z79899 Other long term (current) drug therapy: Secondary | ICD-10-CM | POA: Diagnosis not present

## 2016-02-05 DIAGNOSIS — M25559 Pain in unspecified hip: Secondary | ICD-10-CM | POA: Diagnosis not present

## 2016-02-05 DIAGNOSIS — M545 Low back pain: Secondary | ICD-10-CM | POA: Diagnosis not present

## 2016-02-05 DIAGNOSIS — R2231 Localized swelling, mass and lump, right upper limb: Secondary | ICD-10-CM | POA: Diagnosis not present

## 2016-02-05 DIAGNOSIS — M461 Sacroiliitis, not elsewhere classified: Secondary | ICD-10-CM | POA: Diagnosis not present

## 2016-02-05 DIAGNOSIS — G43009 Migraine without aura, not intractable, without status migrainosus: Secondary | ICD-10-CM | POA: Diagnosis not present

## 2016-02-05 DIAGNOSIS — G894 Chronic pain syndrome: Secondary | ICD-10-CM | POA: Diagnosis not present

## 2016-03-06 DIAGNOSIS — M545 Low back pain: Secondary | ICD-10-CM | POA: Diagnosis not present

## 2016-03-06 DIAGNOSIS — F411 Generalized anxiety disorder: Secondary | ICD-10-CM | POA: Diagnosis not present

## 2016-04-08 DIAGNOSIS — G894 Chronic pain syndrome: Secondary | ICD-10-CM | POA: Diagnosis not present

## 2016-04-08 DIAGNOSIS — K588 Other irritable bowel syndrome: Secondary | ICD-10-CM | POA: Diagnosis not present

## 2016-04-08 DIAGNOSIS — K219 Gastro-esophageal reflux disease without esophagitis: Secondary | ICD-10-CM | POA: Diagnosis not present

## 2016-04-08 DIAGNOSIS — G43009 Migraine without aura, not intractable, without status migrainosus: Secondary | ICD-10-CM | POA: Diagnosis not present

## 2016-05-06 DIAGNOSIS — K589 Irritable bowel syndrome without diarrhea: Secondary | ICD-10-CM | POA: Diagnosis not present

## 2016-05-06 DIAGNOSIS — G894 Chronic pain syndrome: Secondary | ICD-10-CM | POA: Diagnosis not present

## 2016-05-06 DIAGNOSIS — G43009 Migraine without aura, not intractable, without status migrainosus: Secondary | ICD-10-CM | POA: Diagnosis not present

## 2016-05-06 DIAGNOSIS — M62830 Muscle spasm of back: Secondary | ICD-10-CM | POA: Diagnosis not present

## 2016-07-26 DIAGNOSIS — H5213 Myopia, bilateral: Secondary | ICD-10-CM | POA: Diagnosis not present

## 2016-08-21 DIAGNOSIS — N926 Irregular menstruation, unspecified: Secondary | ICD-10-CM | POA: Diagnosis not present

## 2016-08-21 DIAGNOSIS — M545 Low back pain: Secondary | ICD-10-CM | POA: Diagnosis not present

## 2016-08-21 DIAGNOSIS — Z0000 Encounter for general adult medical examination without abnormal findings: Secondary | ICD-10-CM | POA: Diagnosis not present

## 2016-08-21 DIAGNOSIS — K219 Gastro-esophageal reflux disease without esophagitis: Secondary | ICD-10-CM | POA: Diagnosis not present

## 2016-08-21 DIAGNOSIS — G43009 Migraine without aura, not intractable, without status migrainosus: Secondary | ICD-10-CM | POA: Diagnosis not present

## 2016-11-18 DIAGNOSIS — G43009 Migraine without aura, not intractable, without status migrainosus: Secondary | ICD-10-CM | POA: Diagnosis not present

## 2016-11-18 DIAGNOSIS — F411 Generalized anxiety disorder: Secondary | ICD-10-CM | POA: Diagnosis not present

## 2016-11-18 DIAGNOSIS — G894 Chronic pain syndrome: Secondary | ICD-10-CM | POA: Diagnosis not present

## 2016-11-18 DIAGNOSIS — N926 Irregular menstruation, unspecified: Secondary | ICD-10-CM | POA: Diagnosis not present

## 2017-01-14 DIAGNOSIS — Z0000 Encounter for general adult medical examination without abnormal findings: Secondary | ICD-10-CM | POA: Diagnosis not present

## 2017-01-24 DIAGNOSIS — F419 Anxiety disorder, unspecified: Secondary | ICD-10-CM | POA: Diagnosis not present

## 2017-01-24 DIAGNOSIS — K589 Irritable bowel syndrome without diarrhea: Secondary | ICD-10-CM | POA: Diagnosis not present

## 2017-01-24 DIAGNOSIS — Z0000 Encounter for general adult medical examination without abnormal findings: Secondary | ICD-10-CM | POA: Diagnosis not present

## 2017-01-24 DIAGNOSIS — G8929 Other chronic pain: Secondary | ICD-10-CM | POA: Diagnosis not present

## 2017-02-19 DIAGNOSIS — G894 Chronic pain syndrome: Secondary | ICD-10-CM | POA: Diagnosis not present

## 2017-04-02 DIAGNOSIS — S336XXA Sprain of sacroiliac joint, initial encounter: Secondary | ICD-10-CM | POA: Diagnosis not present

## 2017-04-02 DIAGNOSIS — M9903 Segmental and somatic dysfunction of lumbar region: Secondary | ICD-10-CM | POA: Diagnosis not present

## 2017-04-02 DIAGNOSIS — M9904 Segmental and somatic dysfunction of sacral region: Secondary | ICD-10-CM | POA: Diagnosis not present

## 2017-04-02 DIAGNOSIS — M4727 Other spondylosis with radiculopathy, lumbosacral region: Secondary | ICD-10-CM | POA: Diagnosis not present

## 2017-04-03 DIAGNOSIS — S336XXA Sprain of sacroiliac joint, initial encounter: Secondary | ICD-10-CM | POA: Diagnosis not present

## 2017-04-03 DIAGNOSIS — M9904 Segmental and somatic dysfunction of sacral region: Secondary | ICD-10-CM | POA: Diagnosis not present

## 2017-04-03 DIAGNOSIS — M4727 Other spondylosis with radiculopathy, lumbosacral region: Secondary | ICD-10-CM | POA: Diagnosis not present

## 2017-04-03 DIAGNOSIS — M9903 Segmental and somatic dysfunction of lumbar region: Secondary | ICD-10-CM | POA: Diagnosis not present

## 2017-04-07 DIAGNOSIS — J019 Acute sinusitis, unspecified: Secondary | ICD-10-CM | POA: Diagnosis not present

## 2017-05-08 ENCOUNTER — Telehealth: Payer: Self-pay | Admitting: *Deleted

## 2017-05-08 NOTE — Telephone Encounter (Signed)
Pt called stating that she has bled through 3 tampons in the last hour and that she passed a blood clot the size of a tampon. She is very concerned about the amount of bleeding that she is having. Advised pt that she should be evaluated and since our office is closing she should go to her nearest urgent care or ED. Pt verbalized understanding.

## 2017-05-19 DIAGNOSIS — G894 Chronic pain syndrome: Secondary | ICD-10-CM | POA: Diagnosis not present

## 2017-06-12 DIAGNOSIS — E782 Mixed hyperlipidemia: Secondary | ICD-10-CM | POA: Diagnosis not present

## 2017-06-12 DIAGNOSIS — I10 Essential (primary) hypertension: Secondary | ICD-10-CM | POA: Diagnosis not present

## 2017-06-12 DIAGNOSIS — E119 Type 2 diabetes mellitus without complications: Secondary | ICD-10-CM | POA: Diagnosis not present

## 2017-06-12 DIAGNOSIS — E039 Hypothyroidism, unspecified: Secondary | ICD-10-CM | POA: Diagnosis not present

## 2017-06-12 DIAGNOSIS — E559 Vitamin D deficiency, unspecified: Secondary | ICD-10-CM | POA: Diagnosis not present

## 2017-06-16 DIAGNOSIS — M545 Low back pain: Secondary | ICD-10-CM | POA: Diagnosis not present

## 2017-06-16 DIAGNOSIS — E782 Mixed hyperlipidemia: Secondary | ICD-10-CM | POA: Diagnosis not present

## 2017-06-16 DIAGNOSIS — Z0000 Encounter for general adult medical examination without abnormal findings: Secondary | ICD-10-CM | POA: Diagnosis not present

## 2017-06-16 DIAGNOSIS — G894 Chronic pain syndrome: Secondary | ICD-10-CM | POA: Diagnosis not present

## 2017-06-20 ENCOUNTER — Other Ambulatory Visit (HOSPITAL_COMMUNITY): Payer: Self-pay | Admitting: Internal Medicine

## 2017-06-20 DIAGNOSIS — R229 Localized swelling, mass and lump, unspecified: Principal | ICD-10-CM

## 2017-06-20 DIAGNOSIS — IMO0002 Reserved for concepts with insufficient information to code with codable children: Secondary | ICD-10-CM

## 2017-07-01 ENCOUNTER — Ambulatory Visit (HOSPITAL_COMMUNITY)
Admission: RE | Admit: 2017-07-01 | Discharge: 2017-07-01 | Disposition: A | Discharge status: Home / Self Care | Payer: BLUE CROSS/BLUE SHIELD | Source: Ambulatory Visit | Attending: Internal Medicine | Admitting: Internal Medicine

## 2017-07-01 ENCOUNTER — Encounter (HOSPITAL_COMMUNITY): Payer: BLUE CROSS/BLUE SHIELD

## 2017-07-01 DIAGNOSIS — R928 Other abnormal and inconclusive findings on diagnostic imaging of breast: Secondary | ICD-10-CM | POA: Diagnosis not present

## 2017-07-01 DIAGNOSIS — R229 Localized swelling, mass and lump, unspecified: Secondary | ICD-10-CM

## 2017-07-01 DIAGNOSIS — R922 Inconclusive mammogram: Secondary | ICD-10-CM | POA: Diagnosis not present

## 2017-07-01 DIAGNOSIS — IMO0002 Reserved for concepts with insufficient information to code with codable children: Secondary | ICD-10-CM

## 2017-07-01 DIAGNOSIS — N6320 Unspecified lump in the left breast, unspecified quadrant: Secondary | ICD-10-CM | POA: Diagnosis not present

## 2017-09-17 DIAGNOSIS — M25559 Pain in unspecified hip: Secondary | ICD-10-CM | POA: Diagnosis not present

## 2017-09-17 DIAGNOSIS — M545 Low back pain: Secondary | ICD-10-CM | POA: Diagnosis not present

## 2017-09-17 DIAGNOSIS — F419 Anxiety disorder, unspecified: Secondary | ICD-10-CM | POA: Diagnosis not present

## 2017-09-17 DIAGNOSIS — G894 Chronic pain syndrome: Secondary | ICD-10-CM | POA: Diagnosis not present

## 2017-10-06 ENCOUNTER — Encounter: Payer: Self-pay | Admitting: Adult Health

## 2017-10-06 ENCOUNTER — Ambulatory Visit: Payer: BLUE CROSS/BLUE SHIELD | Admitting: Adult Health

## 2017-10-06 VITALS — BP 120/68 | HR 69 | Ht 63.0 in | Wt 118.0 lb

## 2017-10-06 DIAGNOSIS — N920 Excessive and frequent menstruation with regular cycle: Secondary | ICD-10-CM

## 2017-10-06 DIAGNOSIS — B9689 Other specified bacterial agents as the cause of diseases classified elsewhere: Secondary | ICD-10-CM | POA: Diagnosis not present

## 2017-10-06 DIAGNOSIS — N760 Acute vaginitis: Secondary | ICD-10-CM

## 2017-10-06 DIAGNOSIS — N898 Other specified noninflammatory disorders of vagina: Secondary | ICD-10-CM

## 2017-10-06 LAB — POCT WET PREP (WET MOUNT): Clue Cells Wet Prep Whiff POC: NEGATIVE

## 2017-10-06 MED ORDER — METRONIDAZOLE 500 MG PO TABS: 500.0000 mg | ORAL_TABLET | Freq: Two times a day (BID) | ORAL | 0 refills | Status: DC

## 2017-10-06 NOTE — Patient Instructions (Signed)
Bacterial Vaginosis Bacterial vaginosis is a vaginal infection that occurs when the normal balance of bacteria in the vagina is disrupted. It results from an overgrowth of certain bacteria. This is the most common vaginal infection among women ages 15-44. Because bacterial vaginosis increases your risk for STIs (sexually transmitted infections), getting treated can help reduce your risk for chlamydia, gonorrhea, herpes, and HIV (human immunodeficiency virus). Treatment is also important for preventing complications in pregnant women, because this condition can cause an early (premature) delivery. What are the causes? This condition is caused by an increase in harmful bacteria that are normally present in small amounts in the vagina. However, the reason that the condition develops is not fully understood. What increases the risk? The following factors may make you more likely to develop this condition:  Having a new sexual partner or multiple sexual partners.  Having unprotected sex.  Douching.  Having an intrauterine device (IUD).  Smoking.  Drug and alcohol abuse.  Taking certain antibiotic medicines.  Being pregnant.  You cannot get bacterial vaginosis from toilet seats, bedding, swimming pools, or contact with objects around you. What are the signs or symptoms? Symptoms of this condition include:  Grey or white vaginal discharge. The discharge can also be watery or foamy.  A fish-like odor with discharge, especially after sexual intercourse or during menstruation.  Itching in and around the vagina.  Burning or pain with urination.  Some women with bacterial vaginosis have no signs or symptoms. How is this diagnosed? This condition is diagnosed based on:  Your medical history.  A physical exam of the vagina.  Testing a sample of vaginal fluid under a microscope to look for a large amount of bad bacteria or abnormal cells. Your health care provider may use a cotton swab  or a small wooden spatula to collect the sample.  How is this treated? This condition is treated with antibiotics. These may be given as a pill, a vaginal cream, or a medicine that is put into the vagina (suppository). If the condition comes back after treatment, a second round of antibiotics may be needed. Follow these instructions at home: Medicines  Take over-the-counter and prescription medicines only as told by your health care provider.  Take or use your antibiotic as told by your health care provider. Do not stop taking or using the antibiotic even if you start to feel better. General instructions  If you have a female sexual partner, tell her that you have a vaginal infection. She should see her health care provider and be treated if she has symptoms. If you have a female sexual partner, he does not need treatment.  During treatment: ? Avoid sexual activity until you finish treatment. ? Do not douche. ? Avoid alcohol as directed by your health care provider. ? Avoid breastfeeding as directed by your health care provider.  Drink enough water and fluids to keep your urine clear or pale yellow.  Keep the area around your vagina and rectum clean. ? Wash the area daily with warm water. ? Wipe yourself from front to back after using the toilet.  Keep all follow-up visits as told by your health care provider. This is important. How is this prevented?  Do not douche.  Wash the outside of your vagina with warm water only.  Use protection when having sex. This includes latex condoms and dental dams.  Limit how many sexual partners you have. To help prevent bacterial vaginosis, it is best to have sex with just   one partner (monogamous).  Make sure you and your sexual partner are tested for STIs.  Wear cotton or cotton-lined underwear.  Avoid wearing tight pants and pantyhose, especially during summer.  Limit the amount of alcohol that you drink.  Do not use any products that  contain nicotine or tobacco, such as cigarettes and e-cigarettes. If you need help quitting, ask your health care provider.  Do not use illegal drugs. Where to find more information:  Centers for Disease Control and Prevention: www.cdc.gov/std  American Sexual Health Association (ASHA): www.ashastd.org  U.S. Department of Health and Human Services, Office on Women's Health: www.womenshealth.gov/ or https://www.womenshealth.gov/a-z-topics/bacterial-vaginosis Contact a health care provider if:  Your symptoms do not improve, even after treatment.  You have more discharge or pain when urinating.  You have a fever.  You have pain in your abdomen.  You have pain during sex.  You have vaginal bleeding between periods. Summary  Bacterial vaginosis is a vaginal infection that occurs when the normal balance of bacteria in the vagina is disrupted.  Because bacterial vaginosis increases your risk for STIs (sexually transmitted infections), getting treated can help reduce your risk for chlamydia, gonorrhea, herpes, and HIV (human immunodeficiency virus). Treatment is also important for preventing complications in pregnant women, because the condition can cause an early (premature) delivery.  This condition is treated with antibiotic medicines. These may be given as a pill, a vaginal cream, or a medicine that is put into the vagina (suppository). This information is not intended to replace advice given to you by your health care provider. Make sure you discuss any questions you have with your health care provider. Document Released: 03/18/2005 Document Revised: 07/22/2016 Document Reviewed: 12/02/2015 Elsevier Interactive Patient Education  2018 Elsevier Inc.  

## 2017-10-06 NOTE — Progress Notes (Signed)
  Subjective:     Patient ID: Carly Baker, female   DOB: December 18, 1970, 47 y.o.   MRN: [id number]  HPI Carly is a 47 year old white female in complaining of vaginal discharge, occasional odor and burning.She thinks its BV, has used metrogel in past.  PCP is Dr Margo AyeHall.   Review of Systems +vaginal discharge, occasional odor, and burning, has used metrogel in the past Periods are heavier, 3/5 days, changes tampons and pads every 2-3 hours Patient denies any headaches, hearing loss, fatigue, blurred vision, shortness of breath, chest pain, abdominal pain, problems with bowel movements(has constipation at times, and uses OTC meds and increased fiber) urination, or intercourse. No joint pain or mood swings. She had labs with Dr Margo AyeHall and was told all normal. Reviewed past medical,surgical, social and family history. Reviewed medications and allergies.     Objective:   Physical Exam BP 120/68 (BP Location: Left Arm, Patient Position: Sitting, Cuff Size: Small)   Pulse 69   Ht 5\' 3"  (1.6 m)   Wt 118 lb (53.5 kg)   LMP 09/14/2017   BMI 20.90 kg/m  Skin warm and dry.Pelvic: external genitalia is normal in appearance no lesions, vagina: scant white discharge without odor,urethra has no lesions or masses noted, cervix:smooth and bulbous, uterus: normal size, shape and contour, non tender, no masses felt, adnexa: no masses or tenderness noted. Bladder is non tender and no masses felt. Wet prep: + few for clue cells and +WBCs. PHQ 9 score 16, is on trintellix and does meditation, denies being suicidal. Discussed getting US to assess uterus, she wants to want for now.    Face time 15 minutes.  Assessment:     1. BV (bacterial vaginosis)   2. Vaginal discharge   3. Menorrhagia with regular cycle       Plan:     Rx flagyl 500 mg 1 bid x 7 days, no alcohol, review handout on BV   Return 7/31 for pap and physical

## 2017-10-24 ENCOUNTER — Encounter: Payer: Self-pay | Admitting: Adult Health

## 2017-10-29 ENCOUNTER — Encounter: Payer: Self-pay | Admitting: Adult Health

## 2017-10-29 ENCOUNTER — Other Ambulatory Visit (HOSPITAL_COMMUNITY)
Admission: RE | Admit: 2017-10-29 | Discharge: 2017-10-29 | Disposition: A | Discharge status: Home / Self Care | Payer: BLUE CROSS/BLUE SHIELD | Source: Ambulatory Visit | Attending: Adult Health | Admitting: Adult Health

## 2017-10-29 ENCOUNTER — Ambulatory Visit (INDEPENDENT_AMBULATORY_CARE_PROVIDER_SITE_OTHER): Payer: BLUE CROSS/BLUE SHIELD | Admitting: Adult Health

## 2017-10-29 VITALS — BP 116/77 | HR 75 | Ht 63.0 in | Wt 114.0 lb

## 2017-10-29 DIAGNOSIS — Z01419 Encounter for gynecological examination (general) (routine) without abnormal findings: Secondary | ICD-10-CM | POA: Diagnosis not present

## 2017-10-29 DIAGNOSIS — Z1211 Encounter for screening for malignant neoplasm of colon: Secondary | ICD-10-CM | POA: Insufficient documentation

## 2017-10-29 DIAGNOSIS — K649 Unspecified hemorrhoids: Secondary | ICD-10-CM | POA: Diagnosis not present

## 2017-10-29 DIAGNOSIS — Z1212 Encounter for screening for malignant neoplasm of rectum: Secondary | ICD-10-CM

## 2017-10-29 LAB — HEMOCCULT GUIAC POC 1CARD (OFFICE): Fecal Occult Blood, POC: NEGATIVE

## 2017-10-29 NOTE — Addendum Note (Signed)
Addended by: Federico FlakeNES, PEGGY A on: 10/29/2017 09:36 AM   Modules accepted: Orders

## 2017-10-29 NOTE — Progress Notes (Signed)
Patient ID: Carly Baker N Lenz, female   DOB: 1970/10/16, 47 y.o.   MRN: [id number] History of Present Illness:  Carly Baker is a 47 year old white female, G1P1 in for well woman gyn exam and pap. PCP is Dr Margo AyeHall.   Current Medications, Allergies, Past Medical History, Past Surgical History, Family History and Social History were reviewed in [facility] electronic medical record.     Review of Systems:  Patient denies any headaches, hearing loss, fatigue, blurred vision, shortness of breath, chest pain, abdominal pain, problems with urination, or intercourse. No joint pain or mood swings. +constipation at times, has increased fiber and water  Physical Exam:BP 116/77 (BP Location: Left Arm, Patient Position: Sitting, Cuff Size: Small)   Pulse 75   Ht 5\' 3"  (1.6 m)   Wt 114 lb (51.7 kg)   LMP 10/07/2017   BMI 20.19 kg/m  General:  Well developed, well nourished, no acute distress Skin:  Warm and dry Neck:  Midline trachea, normal thyroid, good ROM, no lymphadenopathy Lungs; Clear to auscultation bilaterally Breast:  No dominant palpable mass, retraction, or nipple discharge Cardiovascular: Regular rate and rhythm Abdomen:  Soft, non tender, no hepatosplenomegaly Pelvic:  External genitalia is normal in appearance, no lesions.  The vagina is normal in appearance. Urethra has no lesions or masses. The cervix is bulbous. Pap with HPV and GC/CHL performed. Uterus is felt to be normal size, shape, and contour.  No adnexal masses or tenderness noted.Bladder is non tender, no masses felt. Rectal: Good sphincter tone, no polyps, + hemorrhoid felt.  Hemoccult negative. Extremities/musculoskeletal:  No swelling or varicosities noted, no clubbing or cyanosis Psych:  No mood changes, alert and cooperative,seems happy PHQ 9 score 7 is on meds and score is down for 16 on 10/06/17.  Impression: 1. Encounter for gynecological examination with Papanicolaou smear of cervix   2. Screening for colorectal cancer    3. Hemorrhoids, unspecified hemorrhoid type       Plan: Physical in 1 years Pap in 3 if normal Mammogram yearly Labs with PCP Try anusol or preparation H as needed

## 2017-10-31 LAB — CYTOLOGY - PAP
CHLAMYDIA, DNA PROBE: NEGATIVE
DIAGNOSIS: NEGATIVE
HPV: NOT DETECTED
Neisseria Gonorrhea: NEGATIVE

## 2017-11-14 DIAGNOSIS — H00029 Hordeolum internum unspecified eye, unspecified eyelid: Secondary | ICD-10-CM | POA: Diagnosis not present

## 2017-12-17 ENCOUNTER — Ambulatory Visit: Payer: BLUE CROSS/BLUE SHIELD | Admitting: Adult Health

## 2017-12-24 ENCOUNTER — Encounter: Payer: Self-pay | Admitting: Adult Health

## 2017-12-24 ENCOUNTER — Other Ambulatory Visit: Payer: Self-pay

## 2017-12-24 ENCOUNTER — Ambulatory Visit: Payer: BLUE CROSS/BLUE SHIELD | Admitting: Adult Health

## 2017-12-24 VITALS — BP 128/81 | HR 71 | Ht 63.0 in | Wt 116.0 lb

## 2017-12-24 DIAGNOSIS — M25559 Pain in unspecified hip: Secondary | ICD-10-CM | POA: Diagnosis not present

## 2017-12-24 DIAGNOSIS — N943 Premenstrual tension syndrome: Secondary | ICD-10-CM

## 2017-12-24 DIAGNOSIS — M545 Low back pain: Secondary | ICD-10-CM | POA: Diagnosis not present

## 2017-12-24 DIAGNOSIS — N920 Excessive and frequent menstruation with regular cycle: Secondary | ICD-10-CM | POA: Diagnosis not present

## 2017-12-24 DIAGNOSIS — R4586 Emotional lability: Secondary | ICD-10-CM | POA: Diagnosis not present

## 2017-12-24 DIAGNOSIS — G894 Chronic pain syndrome: Secondary | ICD-10-CM | POA: Diagnosis not present

## 2017-12-24 DIAGNOSIS — F419 Anxiety disorder, unspecified: Secondary | ICD-10-CM | POA: Diagnosis not present

## 2017-12-24 MED ORDER — NORETHIN-ETH ESTRAD-FE BIPHAS 1 MG-10 MCG / 10 MCG PO TABS: 1.0000 | ORAL_TABLET | Freq: Every day | ORAL | 4 refills | Status: DC

## 2017-12-24 NOTE — Progress Notes (Signed)
  Subjective:     Patient ID: Carly Baker, female   DOB: Oct 15, 1970, 47 y.o.   MRN: [id number]  HPI Carly is a 47 year old white female in complaining of mood swings. PCP is Dr Margo Aye.  Review of Systems +mood swings esp around period Headaches near period Periods heavier Has wet the bed 2 x in last 2 weeks, will wake up and be peeing, no UTI symptoms Reviewed past medical,surgical, social and family history. Reviewed medications and allergies.     Objective:   Physical Exam BP 128/81 (BP Location: Right Arm, Patient Position: Sitting, Cuff Size: Normal)   Pulse 71   Ht 5\' 3"  (1.6 m)   Wt 116 lb (52.6 kg)   LMP 11/28/2017   BMI 20.55 kg/m   PHQ 2 score 1.She is on trintellex.  She had pap and physical 10/29/17.  Talk only: denies smoking, MI,stroke, DVT or breast cancer, will try low dose OCs to se if helps. She has toe nail fungus left big toe,instricted to see podiatrist.  Face time 15 minutes listen and cousneling.    Assessment:     1. Mood swings   2. PMS (premenstrual syndrome)   3. Menorrhagia with regular cycle       Plan:    Start Lo loestrin with next period Decrease caffeine esp in PM Void before bed Meds ordered this encounter  Medications  . Norethindrone-Ethinyl Estradiol-Fe Biphas (LO LOESTRIN FE) 1 MG-10 MCG / 10 MCG tablet    Sig: Take 1 tablet by mouth daily. Take 1 daily by mouth    Dispense:  3 Package    Refill:  4    BIN [id number], PCN [id number], GRP [id number]    Order Specific Question:   Supervising Provider    Answer:   Duane Lope H [[id number]]  F/U in 8 weeks

## 2018-02-23 ENCOUNTER — Ambulatory Visit: Payer: BLUE CROSS/BLUE SHIELD | Admitting: Adult Health

## 2018-03-16 ENCOUNTER — Telehealth: Payer: Self-pay | Admitting: Adult Health

## 2018-03-16 NOTE — Telephone Encounter (Signed)
Called patient back and asked that she call back and let me know what medication she needs.

## 2018-03-16 NOTE — Telephone Encounter (Signed)
Patient called stating that she would like Victorino DikeJennifer to refill her script until her appointment on 12/23/ @ 3:15pm. Please contact pt

## 2018-03-18 ENCOUNTER — Ambulatory Visit: Payer: BLUE CROSS/BLUE SHIELD | Admitting: Adult Health

## 2018-03-23 ENCOUNTER — Encounter: Payer: Self-pay | Admitting: Adult Health

## 2018-03-23 ENCOUNTER — Ambulatory Visit: Payer: BLUE CROSS/BLUE SHIELD | Admitting: Adult Health

## 2018-03-23 VITALS — BP 115/71 | HR 68 | Ht 63.0 in | Wt 116.6 lb

## 2018-03-23 DIAGNOSIS — Z7689 Persons encountering health services in other specified circumstances: Secondary | ICD-10-CM

## 2018-03-23 NOTE — Progress Notes (Signed)
Patient ID: [place] N Nicklaus, female   DOB: 01-11-1971, 47 y.o.   MRN: [id number] History of Present Illness: [place] is a 47 year old white female, back in follow up after starting lo loestrin for PMS,and menorrhagia.   Current Medications, Allergies, Past Medical History, Past Surgical History, Family History and Social History were reviewed in [facility] electronic medical record.     Review of Systems: Periods are lighter Headaches are good, but has slight one today  PMS is much better    Physical Exam:BP 115/71 (BP Location: Right Arm, Patient Position: Sitting, Cuff Size: Normal)   Pulse 68   Ht 5\' 3"  (1.6 m)   Wt 116 lb 9.6 oz (52.9 kg)   LMP 03/21/2018   BMI 20.65 kg/m  General:  Well developed, well nourished, no acute distress Skin:  Warm and dry Lungs; Clear to auscultation bilaterally Cardiovascular: Regular rate and rhythm Psych:  No mood changes, alert and cooperative,seems happy Fall risk is low. Will continue lo loestirn   Impression: 1. Encounter for menstrual regulation       Plan: Continue lo loestrin, has refills F/U in July for physical

## 2018-04-17 DIAGNOSIS — Z5181 Encounter for therapeutic drug level monitoring: Secondary | ICD-10-CM | POA: Diagnosis not present

## 2018-04-17 DIAGNOSIS — M545 Low back pain: Secondary | ICD-10-CM | POA: Diagnosis not present

## 2018-04-17 DIAGNOSIS — G8929 Other chronic pain: Secondary | ICD-10-CM | POA: Diagnosis not present

## 2018-04-17 DIAGNOSIS — M25559 Pain in unspecified hip: Secondary | ICD-10-CM | POA: Diagnosis not present

## 2018-05-13 DIAGNOSIS — R1032 Left lower quadrant pain: Secondary | ICD-10-CM | POA: Diagnosis not present

## 2018-05-20 ENCOUNTER — Other Ambulatory Visit (HOSPITAL_COMMUNITY): Payer: Self-pay | Admitting: Internal Medicine

## 2018-05-20 ENCOUNTER — Other Ambulatory Visit: Payer: Self-pay | Admitting: Internal Medicine

## 2018-05-20 ENCOUNTER — Ambulatory Visit (HOSPITAL_COMMUNITY): Admission: RE | Admit: 2018-05-20 | Payer: BLUE CROSS/BLUE SHIELD | Source: Ambulatory Visit

## 2018-05-20 DIAGNOSIS — R1032 Left lower quadrant pain: Secondary | ICD-10-CM

## 2018-05-27 ENCOUNTER — Encounter: Payer: Self-pay | Admitting: Internal Medicine

## 2018-06-15 ENCOUNTER — Encounter: Payer: Self-pay | Admitting: Gastroenterology

## 2018-06-15 ENCOUNTER — Telehealth: Payer: Self-pay | Admitting: *Deleted

## 2018-06-15 ENCOUNTER — Encounter: Payer: Self-pay | Admitting: *Deleted

## 2018-06-15 ENCOUNTER — Ambulatory Visit: Payer: BLUE CROSS/BLUE SHIELD | Admitting: Gastroenterology

## 2018-06-15 ENCOUNTER — Other Ambulatory Visit: Payer: Self-pay

## 2018-06-15 ENCOUNTER — Other Ambulatory Visit: Payer: Self-pay | Admitting: *Deleted

## 2018-06-15 VITALS — BP 120/70 | HR 68 | Temp 97.0°F | Ht 63.0 in | Wt 119.8 lb

## 2018-06-15 DIAGNOSIS — R194 Change in bowel habit: Secondary | ICD-10-CM

## 2018-06-15 DIAGNOSIS — R1032 Left lower quadrant pain: Secondary | ICD-10-CM | POA: Diagnosis not present

## 2018-06-15 DIAGNOSIS — K625 Hemorrhage of anus and rectum: Secondary | ICD-10-CM

## 2018-06-15 DIAGNOSIS — E782 Mixed hyperlipidemia: Secondary | ICD-10-CM | POA: Diagnosis not present

## 2018-06-15 DIAGNOSIS — N939 Abnormal uterine and vaginal bleeding, unspecified: Secondary | ICD-10-CM | POA: Diagnosis not present

## 2018-06-15 DIAGNOSIS — I482 Chronic atrial fibrillation, unspecified: Secondary | ICD-10-CM | POA: Diagnosis not present

## 2018-06-15 DIAGNOSIS — M545 Low back pain: Secondary | ICD-10-CM | POA: Diagnosis not present

## 2018-06-15 DIAGNOSIS — M816 Localized osteoporosis [Lequesne]: Secondary | ICD-10-CM | POA: Diagnosis not present

## 2018-06-15 DIAGNOSIS — E6609 Other obesity due to excess calories: Secondary | ICD-10-CM | POA: Diagnosis not present

## 2018-06-15 MED ORDER — FLUCONAZOLE 150 MG PO TABS: ORAL_TABLET | ORAL | 0 refills | Status: DC

## 2018-06-15 MED ORDER — DICYCLOMINE HCL 10 MG PO CAPS: 10.0000 mg | ORAL_CAPSULE | Freq: Three times a day (TID) | ORAL | 3 refills | Status: DC

## 2018-06-15 MED ORDER — METRONIDAZOLE 500 MG PO TABS: 500.0000 mg | ORAL_TABLET | Freq: Three times a day (TID) | ORAL | 0 refills | Status: DC

## 2018-06-15 MED ORDER — CIPROFLOXACIN HCL 500 MG PO TABS: 500.0000 mg | ORAL_TABLET | Freq: Two times a day (BID) | ORAL | 0 refills | Status: DC

## 2018-06-15 MED ORDER — PEG 3350-KCL-NA BICARB-NACL 420 G PO SOLR: 4000.0000 mL | Freq: Once | ORAL | 0 refills | Status: AC

## 2018-06-15 NOTE — Telephone Encounter (Signed)
Pre-op scheduled for 5/6 at 12:45pm. Patient aware. Letter mailed

## 2018-06-15 NOTE — Telephone Encounter (Signed)
Patient called. She picked up her medication from the pharmacy but stated the diflucan was not there. She is asking for this to be called.

## 2018-06-15 NOTE — Telephone Encounter (Signed)
done

## 2018-06-15 NOTE — Patient Instructions (Addendum)
Take Cipro and Flagyl for 10 days for possible diverticulitis.  Bentyl 10mg  up to four times daily as needed for abdominal pain and loose stool.   After taking Cipro and Flagyl, try weaning off Bentyl to see if you have persistent pain. Let me know if your pain persists!!!!  Plan on a colonoscopy no sooner than six weeks from now.

## 2018-06-15 NOTE — Progress Notes (Signed)
Primary Care Physician:  Benita Stabile, MD  Primary Gastroenterologist:  Roetta Sessions, MD   Chief Complaint  Patient presents with  . Abdominal Pain    has not had any pain since starting bentyl 10 days ago. She tried to stop it but then would have abd pain    HPI:  Carly Baker is a 48 y.o. female here at the request of Dr. Margo Aye for further evaluation of left lower quadrant abdominal pain.  Patient states she was told she had IBS in the past.  She has been a vegetarian since the early 2000's.  Along the way she would note that Klonopin, Trintellix, Advil which seemed to help some of her abdominal discomfort.  She would also use ginger and chamomile.  Left lower quadrant discomfort would be intermittent in nature.  She would go from having no stools to having [place] 1-2, sometimes having "blowouts".  Denies melena or rectal bleeding.  Her entire family seems to have "gut problems".  Mother always has diarrhea.  She had a sister who since passed away, she used to be on Bentyl.  Patient is also tried gluten-free diet for 6 to 7 weeks without any relief in her abdominal pain.  She saw her PCP who started her on Bentyl 20 mg, she has been taking about 3 times a day.  This has helped with her abdominal pain but she notes if she misses a dose or tries to stop the medication she has recurrent pain.  She has had issues with drowsiness on the medication.  Prior to seeing her PCP she has had worsening symptoms, got to the point that she quit eating and only consumed liquid diet because of the abdominal discomfort.  Occasional bright red blood per rectum.  No melena.  She denies any upper GI symptoms.      Current Outpatient Medications  Medication Sig Dispense Refill  . carisoprodol (SOMA) 350 MG tablet Take 350 mg by mouth 4 (four) times daily as needed for muscle spasms.    . clonazePAM (KLONOPIN) 0.5 MG tablet Take 0.5 mg by mouth 2 (two) times daily as needed for anxiety.    . dicyclomine  (BENTYL) 20 MG tablet Take 20 mg by mouth every 6 (six) hours.    Marland Kitchen ibuprofen (ADVIL,MOTRIN) 200 MG tablet Take 200 mg by mouth every 6 (six) hours as needed.    . loratadine (CLARITIN) 10 MG tablet Take 10 mg by mouth daily.    . Multiple Vitamins-Minerals (ALIVE WOMENS GUMMY PO) Take by mouth. Takes 2 daily    . Probiotic Product (PROBIOTIC PO) Take by mouth daily. Ultimate Flora    . traMADol (ULTRAM) 50 MG tablet Take by mouth every 6 (six) hours as needed.    . vortioxetine HBr (TRINTELLIX) 20 MG TABS tablet Take 20 mg by mouth once.     .       .       . Multiple Vitamins-Minerals (ZINC PO) Take by mouth daily.     No current facility-administered medications for this visit.     Allergies as of 06/15/2018 - Review Complete 06/15/2018  Allergen Reaction Noted  . Erythromycin Hives 02/29/2012    Past Medical History:  Diagnosis Date  . Abdominal cramps 11/07/2014  . Arthritis   . Back pain   . Depressed   . Headache associated with orgasm 11/07/2014  . History of panic attacks 11/07/2014  . Migraine   . Missed periods 11/07/2014  . Sinus  infection 11/07/2014  . Vaginal Pap smear, abnormal     Past Surgical History:  Procedure Laterality Date  . urethra strecthed      Family History  Adopted: Yes  Problem Relation Age of Onset  . Kidney disease Mother        renal failure  . Hodgkin's lymphoma Mother   . Congestive Heart Failure Maternal Grandmother   . Cancer Maternal Grandmother        breast  . Congestive Heart Failure Maternal Grandfather   . Bladder Cancer Maternal Grandfather   . Other Paternal Grandmother        aneursym  . Kidney disease Paternal Grandfather        was on dialysis  . Other Sister        drowned  . Diabetes Sister        Type 1  . Diabetes Sister        Type 1  . Colon cancer Neg Hx   . Inflammatory bowel disease Neg Hx     Social History   Socioeconomic History  . Marital status: Divorced    Spouse name: Not on file  . Number of  children: 1  . Years of education: Not on file  . Highest education level: Not on file  Occupational History  . Not on file  Social Needs  . Financial resource strain: Not on file  . Food insecurity:    Worry: Not on file    Inability: Not on file  . Transportation needs:    Medical: Not on file    Non-medical: Not on file  Tobacco Use  . Smoking status: Former Smoker    Types: Cigarettes  . Smokeless tobacco: Never Used  Substance and Sexual Activity  . Alcohol use: No  . Drug use: No  . Sexual activity: Yes    Birth control/protection: None, Other-see comments, Pill    Comment: vasectomy  Lifestyle  . Physical activity:    Days per week: Not on file    Minutes per session: Not on file  . Stress: Not on file  Relationships  . Social connections:    Talks on phone: Not on file    Gets together: Not on file    Attends religious service: Not on file    Active member of club or organization: Not on file    Attends meetings of clubs or organizations: Not on file    Relationship status: Not on file  . Intimate partner violence:    Fear of current or ex partner: Not on file    Emotionally abused: Not on file    Physically abused: Not on file    Forced sexual activity: Not on file  Other Topics Concern  . Not on file  Social History Narrative  . Not on file      ROS:  General: Negative for anorexia, weight loss, fever, chills, fatigue, weakness. Eyes: Negative for vision changes.  ENT: Negative for hoarseness, difficulty swallowing , nasal congestion. CV: Negative for chest pain, angina, palpitations, dyspnea on exertion, peripheral edema.  Respiratory: Negative for dyspnea at rest, dyspnea on exertion, cough, sputum, wheezing.  GI: See history of present illness. GU:  Negative for dysuria, hematuria, urinary incontinence, urinary frequency, nocturnal urination.  MS: Negative for joint pain, low back pain.  Derm: Negative for rash or itching.  Neuro: Negative for  weakness, abnormal sensation, seizure, frequent headaches, memory loss, confusion.  Psych: Negative for anxiety, depression, suicidal ideation, hallucinations.  Endo: Negative for unusual weight change.  Heme: Negative for bruising or bleeding. Allergy: Negative for rash or hives.    Physical Examination:  BP 120/70   Pulse 68   Temp (!) 97 F (36.1 C) (Oral)   Ht 5\' 3"  (1.6 m)   Wt 119 lb 12.8 oz (54.3 kg)   LMP 06/02/2018   BMI 21.22 kg/m    General: Well-nourished, well-developed in no acute distress.  Head: Normocephalic, atraumatic.   Eyes: Conjunctiva pink, no icterus. Mouth: Oropharyngeal mucosa moist and pink , no lesions erythema or exudate. Neck: Supple without thyromegaly, masses, or lymphadenopathy.  Lungs: Clear to auscultation bilaterally.  Heart: Regular rate and rhythm, no murmurs rubs or gallops.  Abdomen: Bowel sounds are normal, mild lower abdominal tenderness left greater than right, nondistended, no hepatosplenomegaly or masses, no abdominal bruits or    hernia , no rebound or guarding.   Rectal: Not performed Extremities: No lower extremity edema. No clubbing or deformities.  Neuro: Alert and oriented x 4 , grossly normal neurologically.  Skin: Warm and dry, no rash or jaundice.   Psych: Alert and cooperative, normal mood and affect.

## 2018-06-15 NOTE — Addendum Note (Signed)
Addended by: Tiffany Kocher on: 06/15/2018 10:10 PM   Modules accepted: Orders

## 2018-06-17 ENCOUNTER — Other Ambulatory Visit: Payer: BLUE CROSS/BLUE SHIELD

## 2018-06-17 ENCOUNTER — Other Ambulatory Visit: Payer: Self-pay

## 2018-06-17 ENCOUNTER — Telehealth: Payer: Self-pay

## 2018-06-17 DIAGNOSIS — R1032 Left lower quadrant pain: Secondary | ICD-10-CM

## 2018-06-17 DIAGNOSIS — R140 Abdominal distension (gaseous): Secondary | ICD-10-CM

## 2018-06-17 NOTE — Telephone Encounter (Signed)
Per MyChart message from [facility]: Received MyChart message from patient today with complaints of abdominal bloating, abdominal discomfort, feeling like she is going to explode. Worse than yesterday. Bowel function remains good. Getting no relief with Bentyl as she has in the past.  Would offer her CT abdomen and pelvis with contrast today or tomorrow. Diagnosis: left lower quadrant abdominal pain, bloating.  [place]ed [place], no [place] needed for CT. Confirmation# [id number].  Central Scheduling advised it would be best for pt to go to [facility] if she lives in [place]. Called pt, [facility] near her house. Last ate today at approx 1:15pm.  Central Scheduling advised pt can go now to [facility]. Called and informed pt. Advised her to be NPO. She had something to take care of then will go for CT. Advised her to go as soon as she can. Address given.

## 2018-06-17 NOTE — Assessment & Plan Note (Signed)
Very pleasant 48 year old female with chronic GI issues presenting with complaints of intermittent left lower quadrant pain associated with alternating constipation/diarrhea, intermittent rectal bleeding.  She noted some improvement in her abdominal pain on Bentyl, she was taking 20 mg 3 times daily.  Complained of drowsiness with this regimen.  Stopped the medication for a couple of days because of drowsiness, and noted recurrent left lower quadrant abdominal pain.  This time her bowel function is improved.  She continues to have left lower quadrant discomfort.  Mild to moderate tenderness noted on exam.  We discussed empirically treating her for possible diverticulitis prior to pursuing colonoscopy for rectal bleeding.  We discussed if persistent abdominal pain she will let me know prior to her colonoscopy so we can consider CT imaging.  We discussed risk of colonoscopy in the setting of acute diverticulitis including possibility of perforation.  For now we will schedule colonoscopy about 6 weeks from now.  I have discussed the risks, alternatives, benefits with regards to but not limited to the risk of reaction to medication, bleeding, infection, perforation and the patient is agreeable to proceed. Written consent to be obtained.  Treat with Cipro and Flagyl for 10 days.  Decrease Bentyl to 10 mg 3 times daily as needed due to drowsiness.  She will call let me know if she has persistent abdominal pain.

## 2018-06-17 NOTE — Telephone Encounter (Signed)
Received MyChart message from patient today with complaints of abdominal bloating, abdominal discomfort, feeling like she is going to explode.  Worse than yesterday.  Bowel function remains good.  Getting no relief with Bentyl as she has in the past.  Would offer her CT abdomen and pelvis with contrast today or tomorrow.  Diagnosis: Left lower quadrant abdominal pain, bloating.

## 2018-06-17 NOTE — Telephone Encounter (Signed)
Received MyChart message from patient today with complaints of abdominal bloating, abdominal discomfort, feeling like she is going to explode.  Worse than yesterday.  Bowel function remains good.  Getting no relief with Bentyl as she has in the past.  Would offer her CT abdomen and pelvis with contrast today or tomorrow.  Diagnosis: Left lower quadrant abdominal pain, bloating. 

## 2018-06-18 ENCOUNTER — Ambulatory Visit (INDEPENDENT_AMBULATORY_CARE_PROVIDER_SITE_OTHER): Payer: BLUE CROSS/BLUE SHIELD

## 2018-06-18 ENCOUNTER — Other Ambulatory Visit: Payer: Self-pay

## 2018-06-18 DIAGNOSIS — R140 Abdominal distension (gaseous): Secondary | ICD-10-CM

## 2018-06-18 DIAGNOSIS — R1032 Left lower quadrant pain: Secondary | ICD-10-CM

## 2018-06-18 MED ORDER — IOHEXOL 300 MG/ML  SOLN
100.0000 mL | Freq: Once | INTRAMUSCULAR | Status: AC | PRN
  Administered 2018-06-18: 100 mL via INTRAVENOUS

## 2018-06-18 NOTE — Progress Notes (Signed)
cc'd to pcp 

## 2018-06-24 DIAGNOSIS — K588 Other irritable bowel syndrome: Secondary | ICD-10-CM | POA: Diagnosis not present

## 2018-06-24 DIAGNOSIS — E782 Mixed hyperlipidemia: Secondary | ICD-10-CM | POA: Diagnosis not present

## 2018-06-24 DIAGNOSIS — F411 Generalized anxiety disorder: Secondary | ICD-10-CM | POA: Diagnosis not present

## 2018-06-24 DIAGNOSIS — M1711 Unilateral primary osteoarthritis, right knee: Secondary | ICD-10-CM | POA: Diagnosis not present

## 2018-07-07 ENCOUNTER — Telehealth: Payer: Self-pay | Admitting: Gastroenterology

## 2018-07-07 DIAGNOSIS — R197 Diarrhea, unspecified: Secondary | ICD-10-CM | POA: Diagnosis not present

## 2018-07-07 DIAGNOSIS — R509 Fever, unspecified: Secondary | ICD-10-CM | POA: Diagnosis not present

## 2018-07-07 MED ORDER — VANCOMYCIN HCL 125 MG PO CAPS: 125.0000 mg | ORAL_CAPSULE | Freq: Four times a day (QID) | ORAL | 0 refills | Status: AC

## 2018-07-07 NOTE — Telephone Encounter (Signed)
Called and spoke to patient after receiving multiple MyChart messages.    Symptoms started around Wednesday or Thursday of last week.  Noticed mucus in stool.  Stools are went from semi-formed to watery over the last 24 hours.Started running temperature (7:30pm) 101+, rechecked around 4am 101+. Staying at least above 99.  Also with migraine.  Some mild bright red blood per rectum yesterday.  Rectum hurts.  No abdominal pain.  No nausea or vomiting.  No one else around her with diarrhea.  She has had issues with her seasonal allergies.  Developed some laryngitis on Friday.  Saturday had sore throat which improved with gargling with salt water.  Developed this cough on Sunday but only lasted for 24 hours.  No chest pain or shortness of breath.  She also put in a call to Karie Fetch, NP with Dr. Scharlene Gloss office.  Virtual visit planned for this morning to discuss possibility of COVID-19 testing.  Advised patient to stay home and to isolate herself until we know more what is going on.  Cannot rule out COVID 19.  She needs to stay at home and isolate at least until 72 hours after symptoms resolve. Advised to use separate bathroom if possible, use clorox based cleaners in the bathroom facilities. Advised her that [facility] is not testing outpatients at this time.  She can always call her local health department if testing is needed when she discusses with PCP.  Given diarrhea is predominant symptom, cannot exclude GI etiology such as C. difficile given recent antibiotics.  Cannot rule out other infectious etiologies or inflammatory bowel disease.  Patient tells me that currently she is without a car, does not feel like she could get to the lab for stool testing.  She is only scheduled for colonoscopy next month, may have to be rescheduled depending on how she does and whether or not this becomes more of an urgent issue.  Only urgent procedures are being done right now.  Send in rx for vancomycin 125mg  qid for 10  days for possible cdiff.   She will keep posted on what her PCP's plan is as well.

## 2018-07-13 ENCOUNTER — Telehealth: Payer: Self-pay | Admitting: Gastroenterology

## 2018-07-13 DIAGNOSIS — R634 Abnormal weight loss: Secondary | ICD-10-CM

## 2018-07-13 DIAGNOSIS — R197 Diarrhea, unspecified: Secondary | ICD-10-CM

## 2018-07-13 NOTE — Telephone Encounter (Signed)
Spoke with pt. She is aware that she can go to either [facility] if she can't find the one [facility] is referring to per [employer]. Orders were already placed for pt.

## 2018-07-13 NOTE — Telephone Encounter (Signed)
Spoke with patient via mychart. She needs labs at [facility]. Orders placed.

## 2018-08-04 ENCOUNTER — Telehealth: Payer: Self-pay

## 2018-08-04 ENCOUNTER — Encounter (HOSPITAL_COMMUNITY): Payer: Self-pay

## 2018-08-04 ENCOUNTER — Other Ambulatory Visit: Payer: Self-pay

## 2018-08-04 NOTE — Telephone Encounter (Signed)
Pt called to ask [facility] to check her mychart message. Message was previously routed by MS.

## 2018-08-04 NOTE — Telephone Encounter (Signed)
Addressed.

## 2018-08-04 NOTE — Telephone Encounter (Signed)
Noted  

## 2018-08-05 ENCOUNTER — Encounter (HOSPITAL_COMMUNITY)
Admission: RE | Admit: 2018-08-05 | Discharge: 2018-08-05 | Disposition: A | Discharge status: Home / Self Care | Payer: BLUE CROSS/BLUE SHIELD | Source: Ambulatory Visit | Attending: Internal Medicine | Admitting: Internal Medicine

## 2018-08-06 NOTE — Telephone Encounter (Signed)
Called patient and made aware will need to go exact prep directions. She stated she was just trying to be funny. She had no further questions

## 2018-08-10 ENCOUNTER — Ambulatory Visit (HOSPITAL_COMMUNITY)
Admission: RE | Admit: 2018-08-10 | Discharge: 2018-08-10 | Disposition: A | Discharge status: Home / Self Care | Payer: BLUE CROSS/BLUE SHIELD | Attending: Internal Medicine | Admitting: Internal Medicine

## 2018-08-10 ENCOUNTER — Encounter (HOSPITAL_COMMUNITY)
Admission: RE | Disposition: A | Discharge status: Home / Self Care | Payer: Self-pay | Source: Home / Self Care | Attending: Internal Medicine

## 2018-08-10 ENCOUNTER — Encounter (HOSPITAL_COMMUNITY): Payer: Self-pay | Admitting: Anesthesiology

## 2018-08-10 ENCOUNTER — Ambulatory Visit (HOSPITAL_COMMUNITY): Payer: BLUE CROSS/BLUE SHIELD | Admitting: Anesthesiology

## 2018-08-10 DIAGNOSIS — Z881 Allergy status to other antibiotic agents status: Secondary | ICD-10-CM | POA: Diagnosis not present

## 2018-08-10 DIAGNOSIS — K625 Hemorrhage of anus and rectum: Secondary | ICD-10-CM

## 2018-08-10 DIAGNOSIS — Z87891 Personal history of nicotine dependence: Secondary | ICD-10-CM | POA: Insufficient documentation

## 2018-08-10 DIAGNOSIS — F329 Major depressive disorder, single episode, unspecified: Secondary | ICD-10-CM | POA: Diagnosis not present

## 2018-08-10 DIAGNOSIS — G43909 Migraine, unspecified, not intractable, without status migrainosus: Secondary | ICD-10-CM | POA: Insufficient documentation

## 2018-08-10 DIAGNOSIS — Z791 Long term (current) use of non-steroidal anti-inflammatories (NSAID): Secondary | ICD-10-CM | POA: Insufficient documentation

## 2018-08-10 DIAGNOSIS — Z79899 Other long term (current) drug therapy: Secondary | ICD-10-CM | POA: Insufficient documentation

## 2018-08-10 DIAGNOSIS — Z833 Family history of diabetes mellitus: Secondary | ICD-10-CM | POA: Insufficient documentation

## 2018-08-10 DIAGNOSIS — R194 Change in bowel habit: Secondary | ICD-10-CM

## 2018-08-10 DIAGNOSIS — Z8052 Family history of malignant neoplasm of bladder: Secondary | ICD-10-CM | POA: Insufficient documentation

## 2018-08-10 DIAGNOSIS — R1032 Left lower quadrant pain: Secondary | ICD-10-CM

## 2018-08-10 DIAGNOSIS — K529 Noninfective gastroenteritis and colitis, unspecified: Secondary | ICD-10-CM | POA: Diagnosis not present

## 2018-08-10 DIAGNOSIS — F410 Panic disorder [episodic paroxysmal anxiety] without agoraphobia: Secondary | ICD-10-CM | POA: Diagnosis not present

## 2018-08-10 DIAGNOSIS — Z803 Family history of malignant neoplasm of breast: Secondary | ICD-10-CM | POA: Diagnosis not present

## 2018-08-10 DIAGNOSIS — Z841 Family history of disorders of kidney and ureter: Secondary | ICD-10-CM | POA: Insufficient documentation

## 2018-08-10 DIAGNOSIS — K921 Melena: Secondary | ICD-10-CM

## 2018-08-10 DIAGNOSIS — M1990 Unspecified osteoarthritis, unspecified site: Secondary | ICD-10-CM | POA: Diagnosis not present

## 2018-08-10 DIAGNOSIS — Z8249 Family history of ischemic heart disease and other diseases of the circulatory system: Secondary | ICD-10-CM | POA: Insufficient documentation

## 2018-08-10 HISTORY — PX: BIOPSY: SHX5522

## 2018-08-10 HISTORY — PX: COLONOSCOPY WITH PROPOFOL: SHX5780

## 2018-08-10 LAB — PREGNANCY, URINE: Preg Test, Ur: NEGATIVE

## 2018-08-10 SURGERY — COLONOSCOPY WITH PROPOFOL
Anesthesia: Monitor Anesthesia Care

## 2018-08-10 MED ORDER — CHLORHEXIDINE GLUCONATE CLOTH 2 % EX PADS: 6.0000 | MEDICATED_PAD | Freq: Once | CUTANEOUS | Status: DC

## 2018-08-10 MED ORDER — PROPOFOL 500 MG/50ML IV EMUL
INTRAVENOUS | Status: DC | PRN
  Administered 2018-08-10: 10:00:00 via INTRAVENOUS
  Administered 2018-08-10: 150 ug/kg/min via INTRAVENOUS

## 2018-08-10 MED ORDER — PROPOFOL 10 MG/ML IV BOLUS
INTRAVENOUS | Status: AC
  Filled 2018-08-10: qty 40

## 2018-08-10 MED ORDER — KETAMINE HCL 10 MG/ML IJ SOLN
INTRAMUSCULAR | Status: DC | PRN
  Administered 2018-08-10: 5 mg via INTRAVENOUS
  Administered 2018-08-10: 10 mg via INTRAVENOUS

## 2018-08-10 MED ORDER — GLYCOPYRROLATE 0.2 MG/ML IJ SOLN
INTRAMUSCULAR | Status: DC | PRN
  Administered 2018-08-10: 0.2 mg via INTRAVENOUS

## 2018-08-10 MED ORDER — KETOROLAC TROMETHAMINE 15 MG/ML IJ SOLN
15.0000 mg | Freq: Once | INTRAMUSCULAR | Status: AC
  Administered 2018-08-10: 15 mg via INTRAVENOUS

## 2018-08-10 MED ORDER — LACTATED RINGERS IV SOLN
INTRAVENOUS | Status: DC
  Administered 2018-08-10: 1000 mL via INTRAVENOUS

## 2018-08-10 MED ORDER — LIDOCAINE HCL (CARDIAC) PF 100 MG/5ML IV SOSY
PREFILLED_SYRINGE | INTRAVENOUS | Status: DC | PRN
  Administered 2018-08-10: 40 mg via INTRAVENOUS

## 2018-08-10 MED ORDER — KETAMINE HCL 50 MG/5ML IJ SOSY
PREFILLED_SYRINGE | INTRAMUSCULAR | Status: AC
  Filled 2018-08-10: qty 5

## 2018-08-10 MED ORDER — PROPOFOL 10 MG/ML IV BOLUS
INTRAVENOUS | Status: DC | PRN
  Administered 2018-08-10 (×3): 20 mg via INTRAVENOUS

## 2018-08-10 MED ORDER — KETOROLAC TROMETHAMINE 15 MG/ML IJ SOLN
INTRAMUSCULAR | Status: AC
  Filled 2018-08-10: qty 1

## 2018-08-10 NOTE — Transfer of Care (Signed)
Immediate Anesthesia Transfer of Care Note  Patient: Carly Baker  Procedure(s) Performed: COLONOSCOPY WITH PROPOFOL (N/A ) BIOPSY  Patient Location: PACU  Anesthesia Type:MAC  Level of Consciousness: awake, alert , oriented and patient cooperative  Airway & Oxygen Therapy: Patient Spontanous Breathing  Post-op Assessment: Report given to RN and Post -op Vital signs reviewed and stable  Post vital signs: Reviewed and stable  Last Vitals:  Vitals Value Taken Time  BP 112/59 08/10/2018 10:45 AM  Temp 36.3 C 08/10/2018 10:43 AM  Pulse 95 08/10/2018 10:47 AM  Resp 14 08/10/2018 10:47 AM  SpO2 100 % 08/10/2018 10:47 AM  Vitals shown include unvalidated device data.  Last Pain:  Vitals:   08/10/18 1009  PainSc: 2          Complications: No apparent anesthesia complications

## 2018-08-10 NOTE — H&P (Signed)
 @LOGO @   Primary Care Physician:  Benita StabileHall, John Z, MD Primary Gastroenterologist:  Dr. Jena Gaussourk  Pre-Procedure History & Physical: HPI:  Carly Baker is a 48 y.o. female here for here for further evaluation of intermittent left-sided abdominal pain and bleeding.  No improvement with Cipro and Flagyl.  Not much change with bowel movements.  Has at least 1 daily.  Sometimes formed sometimes loose.  CT demonstrated degenerative lumbar spine changes no evidence inflammatory process following her bowel or anything else in her abdomen.  Colonoscopy is now being done  Past Medical History:  Diagnosis Date  . Abdominal cramps 11/07/2014  . Arthritis   . Back pain   . Depressed   . Headache associated with orgasm 11/07/2014  . History of panic attacks 11/07/2014  . Migraine   . Missed periods 11/07/2014  . Sinus infection 11/07/2014  . Vaginal Pap smear, abnormal     Past Surgical History:  Procedure Laterality Date  . urethra strecthed      Prior to Admission medications   Medication Sig Start Date End Date Taking? Authorizing Provider  acetaminophen (TYLENOL) 500 MG tablet Take 500 mg by mouth every 6 (six) hours as needed for mild pain or headache.   Yes [provider]  carisoprodol (SOMA) 350 MG tablet Take 175 mg by mouth 4 (four) times daily as needed for muscle spasms.    Yes [provider]  clonazePAM (KLONOPIN) 0.5 MG tablet Take 0.25-0.5 mg by mouth 2 (two) times daily as needed for anxiety (sleep).    Yes [provider]  dicyclomine (BENTYL) 10 MG capsule Take 1 capsule (10 mg total) by mouth 4 (four) times daily -  before meals and at bedtime. As needed for abdominal pain and loose stool Patient taking differently: Take 10 mg by mouth 4 (four) times daily as needed (abdominal pain and loose stool).  06/15/18  Yes Tiffany KocherLewis, Leslie S, PA-C  ibuprofen (ADVIL,MOTRIN) 200 MG tablet Take 800 mg by mouth every 6 (six) hours as needed for headache or mild pain.    Yes  [provider]  loratadine (CLARITIN) 10 MG tablet Take 10 mg by mouth at bedtime.    Yes [provider]  Magnesium 250 MG TABS Take 250 mg by mouth daily.   Yes [provider]  Multiple Vitamins-Minerals (ALIVE WOMENS GUMMY PO) Take 2 tablets by mouth daily.    Yes [provider]  Potassium 99 MG TABS Take 99 mg by mouth daily.   Yes [provider]  Probiotic Product (PROBIOTIC PO) Take 1 tablet by mouth daily.    Yes [provider]  traMADol (ULTRAM) 50 MG tablet Take 50 mg by mouth 2 (two) times daily as needed for moderate pain.    Yes [provider]  TRINTELLIX 10 MG TABS tablet Take 20 mg by mouth at bedtime. 05/25/18  Yes [provider]  TURMERIC PO Take 1 tablet by mouth daily.   Yes [provider]  vitamin B-12 (CYANOCOBALAMIN) 100 MCG tablet Take 100 mcg by mouth daily.   Yes [provider]  vitamin C (ASCORBIC ACID) 500 MG tablet Take 1,000 mg by mouth daily.   Yes [provider]  VITAMIN D PO Take 1 tablet by mouth daily.   Yes [provider]  Zinc Sulfate (ZINC-220 PO) Take 220 mg by mouth daily.   Yes [provider]    Allergies as of 06/15/2018 - Review Complete 06/15/2018  Allergen Reaction  Noted  . Erythromycin Hives 02/29/2012    Family History  Adopted: Yes  Problem Relation Age of Onset  . Kidney disease Mother        renal failure  . Hodgkin's lymphoma Mother   . Congestive Heart Failure Maternal Grandmother   . Cancer Maternal Grandmother        breast  . Congestive Heart Failure Maternal Grandfather   . Bladder Cancer Maternal Grandfather   . Other Paternal Grandmother        aneursym  . Kidney disease Paternal Grandfather        was on dialysis  . Other Sister        drowned  . Diabetes Sister        Type 1  . Diabetes Sister        Type 1  . Colon cancer Neg Hx   . Inflammatory bowel disease Neg Hx     Social History    Socioeconomic History  . Marital status: Divorced    Spouse name: Not on file  . Number of children: 1  . Years of education: Not on file  . Highest education level: Not on file  Occupational History  . Not on file  Social Needs  . Financial resource strain: Not on file  . Food insecurity:    Worry: Not on file    Inability: Not on file  . Transportation needs:    Medical: Not on file    Non-medical: Not on file  Tobacco Use  . Smoking status: Former Smoker    Types: Cigarettes  . Smokeless tobacco: Never Used  Substance and Sexual Activity  . Alcohol use: No  . Drug use: No  . Sexual activity: Yes    Birth control/protection: None, Other-see comments, Pill    Comment: vasectomy  Lifestyle  . Physical activity:    Days per week: Not on file    Minutes per session: Not on file  . Stress: Not on file  Relationships  . Social connections:    Talks on phone: Not on file    Gets together: Not on file    Attends religious service: Not on file    Active member of club or organization: Not on file    Attends meetings of clubs or organizations: Not on file    Relationship status: Not on file  . Intimate partner violence:    Fear of current or ex partner: Not on file    Emotionally abused: Not on file    Physically abused: Not on file    Forced sexual activity: Not on file  Other Topics Concern  . Not on file  Social History Narrative  . Not on file    Review of Systems: See HPI, otherwise negative ROS  Physical Exam: BP (!) 107/58 (BP Location: Left Arm)   Pulse 77   Temp 98.4 F (36.9 C)   Resp 18   SpO2 99%  General:   Alert,  Well-developed, well-nourished, pleasant and cooperative in NAD Skin:  Intact without significant lesions or rashes. Neck:  Supple; no masses or thyromegaly. No significant cervical adenopathy. Lungs:  Clear throughout to auscultation.   No wheezes, crackles, or rhonchi. No acute distress. Heart:  Regular rate and rhythm; no murmurs,  clicks, rubs,  or gallops. Abdomen: Non-distended, normal bowel sounds.  Soft and nontender without appreciable mass or hepatosplenomegaly.  Pulses:  Normal pulses noted. Extremities:  Without clubbing or edema.  Impression/Plan: Pleasant 48 year old lady with  left-sided abdominal pain change in bowel habits intermittent hematochezia. Irritable bowel likely.   However, alarm symptoms of bleeding demand further evaluation.  The risks, benefits, limitations, alternatives and imponderables have been reviewed with the patient. Questions have been answered. All parties are agreeable.     Notice: This dictation was prepared with Dragon dictation along with smaller phrase technology. Any transcriptional errors that result from this process are unintentional and may not be corrected upon review.

## 2018-08-10 NOTE — Discharge Instructions (Signed)
°  Colonoscopy Discharge Instructions  Read the instructions outlined below and refer to this sheet in the next few weeks. These discharge instructions provide you with general information on caring for yourself after you leave the hospital. Your doctor may also give you specific instructions. While your treatment has been planned according to the most current medical practices available, unavoidable complications occasionally occur. If you have any problems or questions after discharge, call Dr. Jena Gauss at [phone]. ACTIVITY  You may resume your regular activity, but move at a slower pace for the next 24 hours.   Take frequent rest periods for the next 24 hours.   Walking will help get rid of the air and reduce the bloated feeling in your belly (abdomen).   No driving for 24 hours (because of the medicine (anesthesia) used during the test).    Do not sign any important legal documents or operate any machinery for 24 hours (because of the anesthesia used during the test).  NUTRITION  Drink plenty of fluids.   You may resume your normal diet as instructed by your doctor.   Begin with a light meal and progress to your normal diet. Heavy or fried foods are harder to digest and may make you feel sick to your stomach (nauseated).   Avoid alcoholic beverages for 24 hours or as instructed.  MEDICATIONS  You may resume your normal medications unless your doctor tells you otherwise.  WHAT YOU CAN EXPECT TODAY  Some feelings of bloating in the abdomen.   Passage of more gas than usual.   Spotting of blood in your stool or on the toilet paper.  IF YOU HAD POLYPS REMOVED DURING THE COLONOSCOPY:  No aspirin products for 7 days or as instructed.   No alcohol for 7 days or as instructed.   Eat a soft diet for the next 24 hours.  FINDING OUT THE RESULTS OF YOUR TEST Not all test results are available during your visit. If your test results are not back during the visit, make an appointment  with your caregiver to find out the results. Do not assume everything is normal if you have not heard from your caregiver or the medical facility. It is important for you to follow up on all of your test results.  SEEK IMMEDIATE MEDICAL ATTENTION IF:  You have more than a spotting of blood in your stool.   Your belly is swollen (abdominal distention).   You are nauseated or vomiting.   You have a temperature over 101.   You have abdominal pain or discomfort that is severe or gets worse throughout the day.    Your colon and rectum are abnormal.  Appears inflamed.  Biopsies taken.  Further recommendations in the near future.

## 2018-08-10 NOTE — Anesthesia Postprocedure Evaluation (Signed)
Anesthesia Post Note  Patient: Carly Baker  Procedure(s) Performed: COLONOSCOPY WITH PROPOFOL (N/A ) BIOPSY  Patient location during evaluation: PACU Anesthesia Type: MAC Level of consciousness: awake and alert and oriented Pain management: pain level controlled Vital Signs Assessment: post-procedure vital signs reviewed and stable Respiratory status: spontaneous breathing Cardiovascular status: stable Postop Assessment: no apparent nausea or vomiting Anesthetic complications: no     Last Vitals:  Vitals:   08/10/18 1043 08/10/18 1045  BP: 105/64   Pulse: 94 96  Resp: (!) 21 (!) 22  Temp: (!) 36.3 C   SpO2: 100% 99%    Last Pain:  Vitals:   08/10/18 1009  PainSc: 2                  Larosa Rhines A

## 2018-08-10 NOTE — Op Note (Signed)
[facility] Patient Name: Carly Baker Procedure Date: 08/10/2018 9:34 AM MRN: [id number] Date of Birth: 1970-07-06 Attending MD: Gennette Pac , MD CSN: [id number] Age: 48 Admit Type: Outpatient Procedure:                Colonoscopy Indications:              Abdominal pain in the left lower quadrant,                            Hematochezia Providers:                Gennette Pac, MD, Loma Messing B. Patsy Lager, RN,                            Dyann Ruddle Referring MD:             Kathleene Hazel. Hall MD Medicines:                Propofol per Anesthesia Complications:            No immediate complications. Estimated Blood Loss:     Estimated blood loss was minimal. Procedure:                Pre-Anesthesia Assessment:                           - Prior to the procedure, a History and Physical                            was performed, and patient medications and                            allergies were reviewed. The patient's tolerance of                            previous anesthesia was also reviewed. The risks                            and benefits of the procedure and the sedation                            options and risks were discussed with the patient.                            All questions were answered, and informed consent                            was obtained. Prior Anticoagulants: The patient has                            taken no previous anticoagulant or antiplatelet                            agents. ASA Grade Assessment: II - A patient with  mild systemic disease. After reviewing the risks                            and benefits, the patient was deemed in                            satisfactory condition to undergo the procedure.                           After obtaining informed consent, the colonoscope                            was passed under direct vision. Throughout the                            procedure, the patient's blood  pressure, pulse, and                            oxygen saturations were monitored continuously. The                            [id number]-[id number] ([id number] scope was introduced through                            the and advanced to the 15 cm into the ileum. The                            terminal ileum, ileocecal valve, appendiceal                            orifice, and rectum were photographed. Scope In: 10:18:40 AM Scope Out: 10:37:40 AM Scope Withdrawal Time: 0 hours 11 minutes 23 seconds  Total Procedure Duration: 0 hours 19 minutes 0 seconds  Findings:      The perianal and digital rectal examinations were normal. Abnormal       rectal and colonic mucosa. Patient had 100s of pustular lesions on       erythematous bases measuring anywhere from 2 mm to 8-9 mm scattered from       the rectum all the way to the cecum. Normal-appearing mucosa       interspersed between countless lesions as described. I intubated the       terminal ileum for 15 cm and this segment of the GI tract appeared       normal. Middle biopsies of the colon taken. Impression:               Inflamed appearing rectum and colon with normal                            terminal ileum. Status post segmental biopsy. Moderate Sedation:      Moderate (conscious) sedation was personally administered by an       anesthesia professional. The following parameters were monitored: oxygen       saturation, heart rate, blood pressure, respiratory rate, EKG, adequacy       of pulmonary ventilation, and response to care. Recommendation:           -  Patient has a contact number available for                            emergencies. The signs and symptoms of potential                            delayed complications were discussed with the                            patient. Return to normal activities tomorrow.                            Written discharge instructions were provided to the                            patient.                            - Advance diet as tolerated. Further                            recommendations to follow pending review of                            pathology report. Procedure Code(s):        --- Professional ---                           [phone], Colonoscopy, flexible; diagnostic, including                            collection of specimen(s) by brushing or washing,                            when performed (separate procedure) Diagnosis Code(s):        --- Professional ---                           R10.32, Left lower quadrant pain                           K92.1, Melena (includes Hematochezia) CPT copyright 2019 American Medical Association. All rights reserved. The codes documented in this report are preliminary and upon coder review may  be revised to meet current compliance requirements. Gerrit Friends. Xzavion Doswell, MD Gennette Pac, MD 08/10/2018 10:56:47 AM This report has been signed electronically. Number of Addenda: 0

## 2018-08-10 NOTE — Anesthesia Preprocedure Evaluation (Signed)
Anesthesia Evaluation  Patient identified by MRN, date of birth, ID band Patient awake    Reviewed: Allergy & Precautions, H&P , NPO status , Patient's Chart, lab work & pertinent test results  Airway Mallampati: II  TM Distance: >3 FB Neck ROM: full    Dental no notable dental hx.    Pulmonary neg pulmonary ROS, former smoker,    Pulmonary exam normal breath sounds clear to auscultation       Cardiovascular Exercise Tolerance: Good negative cardio ROS   Rhythm:regular Rate:Normal     Neuro/Psych  Headaches, PSYCHIATRIC DISORDERS Depression    GI/Hepatic negative GI ROS, Neg liver ROS,   Endo/Other  negative endocrine ROS  Renal/GU negative Renal ROS  negative genitourinary   Musculoskeletal   Abdominal   Peds  Hematology negative hematology ROS (+)   Anesthesia Other Findings   Reproductive/Obstetrics negative OB ROS                             Anesthesia Physical Anesthesia Plan  ASA: II  Anesthesia Plan: MAC   Post-op Pain Management:    Induction:   PONV Risk Score and Plan:   Airway Management Planned:   Additional Equipment:   Intra-op Plan:   Post-operative Plan:   Informed Consent: I have reviewed the patients History and Physical, chart, labs and discussed the procedure including the risks, benefits and alternatives for the proposed anesthesia with the patient or authorized representative who has indicated his/her understanding and acceptance.     Dental Advisory Given  Plan Discussed with: CRNA  Anesthesia Plan Comments:         Anesthesia Quick Evaluation

## 2018-08-10 NOTE — Anesthesia Procedure Notes (Signed)
Procedure Name: MAC Performed by: Adams, Amy A, CRNA Pre-anesthesia Checklist: Patient identified, Emergency Drugs available, Suction available, Timeout performed and Patient being monitored Patient Re-evaluated:Patient Re-evaluated prior to induction Oxygen Delivery Method: Non-rebreather mask       

## 2018-08-11 ENCOUNTER — Encounter (HOSPITAL_COMMUNITY): Payer: Self-pay | Admitting: Internal Medicine

## 2018-08-12 ENCOUNTER — Other Ambulatory Visit: Payer: Self-pay | Admitting: Gastroenterology

## 2018-08-12 ENCOUNTER — Encounter: Payer: Self-pay | Admitting: Internal Medicine

## 2018-08-12 ENCOUNTER — Other Ambulatory Visit: Payer: Self-pay

## 2018-08-12 DIAGNOSIS — A09 Infectious gastroenteritis and colitis, unspecified: Secondary | ICD-10-CM

## 2018-08-26 ENCOUNTER — Other Ambulatory Visit (HOSPITAL_COMMUNITY): Payer: Self-pay | Admitting: Internal Medicine

## 2018-08-26 DIAGNOSIS — Z1231 Encounter for screening mammogram for malignant neoplasm of breast: Secondary | ICD-10-CM

## 2018-08-31 DIAGNOSIS — M9902 Segmental and somatic dysfunction of thoracic region: Secondary | ICD-10-CM | POA: Diagnosis not present

## 2018-08-31 DIAGNOSIS — M545 Low back pain: Secondary | ICD-10-CM | POA: Diagnosis not present

## 2018-08-31 DIAGNOSIS — M9903 Segmental and somatic dysfunction of lumbar region: Secondary | ICD-10-CM | POA: Diagnosis not present

## 2018-08-31 DIAGNOSIS — M546 Pain in thoracic spine: Secondary | ICD-10-CM | POA: Diagnosis not present

## 2018-09-04 DIAGNOSIS — M546 Pain in thoracic spine: Secondary | ICD-10-CM | POA: Diagnosis not present

## 2018-09-04 DIAGNOSIS — M545 Low back pain: Secondary | ICD-10-CM | POA: Diagnosis not present

## 2018-09-04 DIAGNOSIS — M9902 Segmental and somatic dysfunction of thoracic region: Secondary | ICD-10-CM | POA: Diagnosis not present

## 2018-09-04 DIAGNOSIS — M9903 Segmental and somatic dysfunction of lumbar region: Secondary | ICD-10-CM | POA: Diagnosis not present

## 2018-09-07 ENCOUNTER — Telehealth: Payer: Self-pay | Admitting: Internal Medicine

## 2018-09-07 NOTE — Telephone Encounter (Signed)
See result note. She will be doing the stool studies.

## 2018-09-07 NOTE — Progress Notes (Signed)
PT has picked up the containers and will be working on getting it back to the lab.

## 2018-09-07 NOTE — Telephone Encounter (Signed)
Pt was returning a call from AM from Friday. Please call her at [phone]

## 2018-09-08 DIAGNOSIS — A09 Infectious gastroenteritis and colitis, unspecified: Secondary | ICD-10-CM | POA: Diagnosis not present

## 2018-09-10 LAB — C. DIFFICILE GDH AND TOXIN A/B
GDH ANTIGEN: DETECTED
MICRO NUMBER:: 552430
SPECIMEN QUALITY:: ADEQUATE
TOXIN A AND B: NOT DETECTED

## 2018-09-10 LAB — CLOSTRIDIUM DIFFICILE TOXIN B, QUALITATIVE, REAL-TIME PCR: Toxigenic C. Difficile by PCR: DETECTED — AB

## 2018-09-11 ENCOUNTER — Telehealth: Payer: Self-pay | Admitting: Gastroenterology

## 2018-09-11 LAB — GASTROINTESTINAL PATHOGEN PANEL PCR
C. difficile Tox A/B, PCR: DETECTED — AB
Campylobacter, PCR: UNDETERMINED — AB
Cryptosporidium, PCR: UNDETERMINED — AB
E coli (ETEC) LT/ST PCR: UNDETERMINED — AB
E coli (STEC) stx1/stx2, PCR: UNDETERMINED — AB
E coli 0157, PCR: UNDETERMINED — AB
Giardia lamblia, PCR: UNDETERMINED — AB
Norovirus, PCR: UNDETERMINED — AB
Rotavirus A, PCR: UNDETERMINED — AB
Salmonella, PCR: UNDETERMINED — AB
Shigella, PCR: UNDETERMINED — AB

## 2018-09-11 MED ORDER — VANCOMYCIN HCL 125 MG PO CAPS: ORAL_CAPSULE | ORAL | 0 refills | Status: DC

## 2018-09-11 NOTE — Telephone Encounter (Signed)
Reviewed stool studies. GDH antigen detected, Toxins not detected. Indeterminate. Sent for PCR and this was positive. She was empirically treated for possible Cdiff in Apri 2020 with Vancomycin 10 days (had been exposed to antibiotics about a month earlier with Cipro/Flagyl for diverticulitis concerns).  Will treat this as first non-complicated recurrence. I would like to do Dificid, but as it is the weekend, I am concerned that she may not be able to pick this up and start due to possibly needing a PA. The other option is vancomycin in pulse tapered fashion, which will be the most feasible.   Vancomycin 125 mg QID X 10 days, followed by 125 mg orally twice daily for 7 days, then 125 mg orally once daily for 7 days,  then125 mg orally every 2 days for 2 weeks.    Discussed sanitization. Sending information in [place]. If no improvement by next week, change to Dificid X 10 days.   Further recommendations per Magda Paganini.

## 2018-09-11 NOTE — Progress Notes (Signed)
[name]: I am helping to cover Carly Baker's results as she is out of town. 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 Please take the following:   Vancomycin 125 mg QID X 10 days, followed by 125 mg orally twice daily for 7 days, then 125 mg orally once daily for 7 days,  Then 125 mg orally every 2 days for 2 weeks.   Call next week with an update.   Follow sanitization guidelines. I have provided a link here.  Please let me know if you are unable to access it.  [email or web address]  Further recommendations when Magda Paganini returns!

## 2018-09-14 ENCOUNTER — Telehealth: Payer: Self-pay

## 2018-09-14 NOTE — Telephone Encounter (Signed)
Keep AUG appt but Can we put on her a cancellation list for RMR only?

## 2018-09-14 NOTE — Telephone Encounter (Signed)
Noted  

## 2018-09-14 NOTE — Telephone Encounter (Signed)
Agree with recommendations provided by AB.   Patient needs OV with RMR only.

## 2018-09-14 NOTE — Telephone Encounter (Signed)
noted 

## 2018-09-14 NOTE — Telephone Encounter (Signed)
RMR next available will be the end of AUG and I made her appt with him for 11/27/2018 at 8am. Will this be ok?

## 2018-09-14 NOTE — Telephone Encounter (Signed)
Critical Labs, verbal report given this morning 09/14/18.  CDiff is positive.

## 2018-09-14 NOTE — Telephone Encounter (Addendum)
Addressed last week by AB.

## 2018-09-15 NOTE — Telephone Encounter (Signed)
I put her on a cancellation list

## 2018-09-16 ENCOUNTER — Telehealth: Payer: Self-pay | Admitting: Internal Medicine

## 2018-09-16 NOTE — Telephone Encounter (Signed)
Noted  

## 2018-09-16 NOTE — Telephone Encounter (Signed)
Noted. Call [facility] and they will contact pt about shipment of Dificid 200 mg bid x 10 days. Ov notes and labs were faxed to [phone]. Pt was scheduled with [facility] on this Friday 09/18/18. Pt is aware of appt time and date. Pt understands that she will d/c Vancomycin when she receives the Dificid. Pt will bring FMLA papers when she comes.

## 2018-09-16 NOTE — Telephone Encounter (Signed)
We need to try and transition her to Dificid 200mg  BID for 10 days. Once medicine on hand then stop vanc.   Avoid all dairy. No raw veggies/fruit. Bland low fat diet only.   Patient also mentioned needing FMLA papers filled out in her mychart message. Please have her bring to the office.   Keep ov with rmr only in 10/2018 BUT she needs ov with APP now.

## 2018-09-16 NOTE — Telephone Encounter (Signed)
AB, I spoke with pt. She feels the antibiotic Vancomycin is making the Cdiff worse. She is taking the medication 4 times daily, c/o stomach cramps and diarrhea. I explained to pt that it's common while taking the antibiotic to have diarrhea. Pt says something isn't right, her stool is green color. Pt was asked if she was taking the antibiotic with food and her replay was yes. Pt doesn't have much of an appetite and eats once daily.

## 2018-09-16 NOTE — Telephone Encounter (Signed)
[phone] patient called and said that her c-diff has gotten worse since taking the antibiotics   Please call

## 2018-09-16 NOTE — Telephone Encounter (Signed)
Please address with Magda Paganini, as she last saw patient. I was covering for her while she was out of the office.

## 2018-09-17 NOTE — Progress Notes (Signed)
Referring Provider: Benita StabileHall, John Z, MD Primary Care Physician:  Benita StabileHall, John Z, MD  Primary GI: Dr. Jena Gaussourk  Chief Complaint  Patient presents with  . Diarrhea    C diff    HPI:   Carly Baker is a 48 y.o. female presenting today for follow up of C. diff. Patient was last seen in our office on 06/15/18. At that time she was scheduled for a colonoscopy and empirically treated for diverticulitis due to ongoing LLQ pain, alternating constipation and diarrhea, and intermittent rectal bleeding. CT abdomen and pelvis was ordered on 06/18/18 due to worsening abdominal pain and bloating. CT with no acute findings. No diverticulitis.  Starting on 4/6 there were several messages back and forth with patient having multiple episodes of loose/watery stools, bloating, and fever. She was empirically treated for C. diff (Vanc 125mg  QID x10 days) at that time as patient did not have transportation to have stool testing completed.   Colonoscopy on 08/10/18 with Inflamed appearing rectum and colon with normal terminal ileum. Biopsies non-specific with minimally active colitis. Stool studies subsequently ordered. C.diff positive on 6/9. Was started on Vancomycin again and was planning for a pulse taper.  Subsequently patient called stating vancomycin is making C. diff worse. Dificid is now being pursued for patient. [facility] is supposed to contact pt about shipment.   Today she states stools were back to normal after first round of vancomycin. Sometimes with constipation. Saturday June 13th started Vancomycin. Saturday she had been having constipation, after starting medicine, she started having diarrhea and cramping. Symptoms progressively worsened and on Tuesday night she had severe cramping, tenderness, and continued diarrhea. Stopped vancomycin on Wednesday the 17th.  Wednesday with 8-9 BMs. Now only 2 BM daily, "water" first thing in the morning then stool consistent with bristol 6 later in the day. Now only  minimally tender in lower abdomen, was severely tender on Wednesday. Oily film on top of water with BMs. No mucous. No hematochezia or melena. No N/V. Only eaten toast and bread the last couple of days. Drinking power aid and pedialyte and taking 3 different probiotics starting Wednesday. Usually takes one BID. Continues to take Bentyl every 6 hours.  No fever or chills.  Felt lightheaded and sleepy yesterday.   Patient explains she didn't even know she had C. Diff because she was asymptomatic this time whereas when she was first treated she had diarrhea, abdominal pain, and fever. States she has been doing a lot of research and that people can have c. diff and not have symptoms and don't require treatment. Also states that she read it can clear on its own and some doctors take a wait and see approach.  Explains she is not going to take vancomycin anymore and Dificid is $50, which she can't afford until next Friday. She wants to continue with her probiotics as she feels like she is doing better with this.     Past Medical History:  Diagnosis Date  . Abdominal cramps 11/07/2014  . Arthritis   . Back pain   . Depressed   . Headache associated with orgasm 11/07/2014  . History of panic attacks 11/07/2014  . Migraine   . Missed periods 11/07/2014  . Sinus infection 11/07/2014  . Vaginal Pap smear, abnormal     Past Surgical History:  Procedure Laterality Date  . BIOPSY  08/10/2018   Procedure: BIOPSY;  Surgeon: Corbin Adeourk, Robert M, MD;  [facility];  Service: Endoscopy;;  colon  .  COLONOSCOPY WITH PROPOFOL N/A 08/10/2018   Procedure: COLONOSCOPY WITH PROPOFOL;  Surgeon: Daneil Dolin, MD;  Location: [facility];  Service: Endoscopy;  Laterality: N/A;  12:00pm  . urethra strecthed      Current Outpatient Medications  Medication Sig Dispense Refill  . acetaminophen (TYLENOL) 500 MG tablet Take 500 mg by mouth every 6 (six) hours as needed for mild pain or headache.    Azucena Freed Serrata  (BOSWELLIA PO) Take by mouth daily.    . carisoprodol (SOMA) 350 MG tablet Take 125 mg by mouth 4 (four) times daily as needed for muscle spasms.     . Cholecalciferol (VITAMIN D3) 25 MCG (1000 UT) CAPS Take by mouth daily.    . clonazePAM (KLONOPIN) 0.5 MG tablet Take 0.25-0.5 mg by mouth daily.     Marland Kitchen dicyclomine (BENTYL) 10 MG capsule Take 1 capsule (10 mg total) by mouth 4 (four) times daily -  before meals and at bedtime. As needed for abdominal pain and loose stool (Patient taking differently: Take 20 mg by mouth 4 (four) times daily as needed (abdominal pain and loose stool). ) 120 capsule 3  . loratadine (CLARITIN) 10 MG tablet Take 10 mg by mouth at bedtime.     . Magnesium 250 MG TABS Take 250 mg by mouth daily.    . Multiple Vitamins-Minerals (ALIVE WOMENS GUMMY PO) Take 2 tablets by mouth daily.     . Omega-3 Fatty Acids (FISH OIL) 1200 MG CAPS Take by mouth daily.    . Potassium 99 MG TABS Take 99 mg by mouth daily.    . Probiotic Product (FLORASTOR PRE) CAPS Take by mouth daily.    . Probiotic Product (PROBIOTIC PO) Take 1 tablet by mouth daily.     . traMADol (ULTRAM) 50 MG tablet Take 50 mg by mouth 2 (two) times daily as needed for moderate pain.     . TRINTELLIX 10 MG TABS tablet Take 20 mg by mouth at bedtime.    . TURMERIC PO Take 1 tablet by mouth daily.    Marland Kitchen UNABLE TO FIND daily. Probiotic Bio-Heal    . vitamin B-12 (CYANOCOBALAMIN) 100 MCG tablet Take 100 mcg by mouth daily.    . vitamin C (ASCORBIC ACID) 500 MG tablet Take 1,000 mg by mouth daily.    Marland Kitchen VITAMIN D PO Take 1 tablet by mouth daily.    . Zinc Sulfate (ZINC-220 PO) Take 220 mg by mouth daily.    . vancomycin (VANCOCIN) 125 MG capsule 4 capsules daily for 10 days, then twice a day for 7 days, then once daily for 7 days, then every other day for 14 days. (Patient not taking: Reported on 09/18/2018) 70 capsule 0   No current facility-administered medications for this visit.     Allergies as of 09/18/2018 -  Review Complete 09/18/2018  Allergen Reaction Noted  . Codeine Nausea And Vomiting 07/31/2018  . Erythromycin Hives 02/29/2012  . Nsaids Other (See Comments) 09/18/2018    Family History  Adopted: Yes  Problem Relation Age of Onset  . Kidney disease Mother        renal failure  . Hodgkin's lymphoma Mother   . Congestive Heart Failure Maternal Grandmother   . Cancer Maternal Grandmother        breast  . Congestive Heart Failure Maternal Grandfather   . Bladder Cancer Maternal Grandfather   . Other Paternal Grandmother        aneursym  . Kidney disease  Paternal Grandfather        was on dialysis  . Other Sister        drowned  . Diabetes Sister        Type 1  . Diabetes Sister        Type 1  . Colon cancer Neg Hx   . Inflammatory bowel disease Neg Hx     Social History   Socioeconomic History  . Marital status: Divorced    Spouse name: Not on file  . Number of children: 1  . Years of education: Not on file  . Highest education level: Not on file  Occupational History  . Not on file  Social Needs  . Financial resource strain: Not on file  . Food insecurity    Worry: Not on file    Inability: Not on file  . Transportation needs    Medical: Not on file    Non-medical: Not on file  Tobacco Use  . Smoking status: Former Smoker    Types: Cigarettes  . Smokeless tobacco: Never Used  Substance and Sexual Activity  . Alcohol use: No  . Drug use: No  . Sexual activity: Yes    Birth control/protection: None, Other-see comments, Pill    Comment: vasectomy  Lifestyle  . Physical activity    Days per week: Not on file    Minutes per session: Not on file  . Stress: Not on file  Relationships  . Social Musicianconnections    Talks on phone: Not on file    Gets together: Not on file    Attends religious service: Not on file    Active member of club or organization: Not on file    Attends meetings of clubs or organizations: Not on file    Relationship status: Not on file   Other Topics Concern  . Not on file  Social History Narrative  . Not on file    Review of Systems: Gen: Denies fever, chills. Denies weakness. CV: Denies chest pain, palpitations, syncope Resp: Denies dyspnea at rest, cough GI: See HPI Derm: Denies rash, itching, dry skin Psych: Denies depression, anxiety Heme: Denies bruising, bleeding  Physical Exam: BP 116/75   Pulse 72   Temp 98.8 F (37.1 C)   Ht 5\' 3"  (1.6 m)   Wt 114 lb 3.2 oz (51.8 kg)   BMI 20.23 kg/m  General:   Alert and oriented. No distress noted. Pleasant and cooperative.  Head:  Normocephalic and atraumatic. Eyes:  Conjuctiva clear without scleral icterus. Mouth:  Oral mucosa pink and moist. Heart:  S1, S2 present without murmurs appreciated. Lungs:  Clear to auscultation bilaterally. No wheezes, rales, or rhonchi. No distress.  Abdomen:  +BS, soft, very minimal tenderness in lower abdomen, non-distended. No rebound or guarding. No HSM or masses noted. Msk:  Symmetrical without gross deformities. Normal posture. Extremities:  Without edema. Neurologic:  Alert and  oriented x4 Psych:  Alert and cooperative. Normal mood and affect.

## 2018-09-18 ENCOUNTER — Ambulatory Visit (INDEPENDENT_AMBULATORY_CARE_PROVIDER_SITE_OTHER): Payer: BC Managed Care – PPO | Admitting: Gastroenterology

## 2018-09-18 ENCOUNTER — Other Ambulatory Visit: Payer: Self-pay

## 2018-09-18 ENCOUNTER — Encounter: Payer: Self-pay | Admitting: Gastroenterology

## 2018-09-18 DIAGNOSIS — A0472 Enterocolitis due to Clostridium difficile, not specified as recurrent: Secondary | ICD-10-CM | POA: Diagnosis not present

## 2018-09-18 NOTE — Assessment & Plan Note (Addendum)
48 yo female presenting for follow up of C. diff diagnosed on 09/08/18 via stool studies. She had previously been treated empirically for C. diff in early April due to multiple episodes of loose/watery stools, bloating, and fever after being empirically treated for diverticulitis in March. Patient states her diarrhea resolved after her treatment in April. Subsequently, she had a colonoscopy on 08/10/18 which revealed a inflammed colon and biopsies non-specific with minimally active colitis. Stool studies were ordered and came back Kellogg detected and Toxin A and B not detected. Reflex PCR positive and patient was started on Vancomycin. Prior to starting antibiotics, patient was asymptomatic with only one loose stool the morning she collected her stool studies. After starting vancomycin her symptoms became progressicely worse over 4 days. Patient stopped Vancomycin  2 days ago and is now only having 1 watery stool in the morning and one [place] 6 later. No signs of systemic toxicity or ileus. Bloating and pain much improved. Patient is taking probiotics at this time and explains that she is not going to take Vancomycin and she can't afford Dificid until next Friday. She is not interested in taking antibiotics at this time and wants to see if she continues to improve with her probiotics.   Considering patient had recently been treated for c.diff prior to her stool studies, was largely asymptomatic, only became symptomatic after starting vancomycin, and is now improving since discontinuing, I feel following patient clinically over the next few days is reasonable as she may just be continuing to shed spores and not have an active infection. Patient is to call early next week and let us know how she is doing. If she does not continue to improve, we will need to start Dificid. Asked nursing staff to check on patient assistance for Dificid in case this was needed.   Discussed getting labs today to check electrolytes as  patient mentioned feeling lightheaded yesterday. Patient doesn't want to pursue this as she feels she is doing ok, able to take in plenty of fluids,  and her diarrhea is improving.   Continue bland diet advancing as tolerated and drinking plenty of fluids.   Return to work precautions discussed with patient.  Follow up as scheduled with Dr. Gala Romney.

## 2018-09-18 NOTE — Patient Instructions (Signed)
1. We will continue to follow your symptoms clinically over the next few days. Please call back early next week and let us know if you are still having diarrhea/abdominal pain/bloating. If you are still symptomatic, we will likely need to move forward with Dificid.   2. I have asked our nursing staff to look into any patient assistance available for Dificid so if you end up needing to take this we will know.   3. Continue with your bland diet. Continue drinking plenty of fluids.   4. You may return to work if you are not having diarrhea. If you are having watery stools and may have them at work, you should stay home.   5. We will work on getting your FMLA filled out and call you when it is ready. Should be early next week.   Aliene Altes, PA-C [facility]

## 2018-09-21 DIAGNOSIS — M9902 Segmental and somatic dysfunction of thoracic region: Secondary | ICD-10-CM | POA: Diagnosis not present

## 2018-09-21 DIAGNOSIS — M25559 Pain in unspecified hip: Secondary | ICD-10-CM | POA: Diagnosis not present

## 2018-09-21 DIAGNOSIS — M546 Pain in thoracic spine: Secondary | ICD-10-CM | POA: Diagnosis not present

## 2018-09-21 DIAGNOSIS — G894 Chronic pain syndrome: Secondary | ICD-10-CM | POA: Diagnosis not present

## 2018-09-21 DIAGNOSIS — A0472 Enterocolitis due to Clostridium difficile, not specified as recurrent: Secondary | ICD-10-CM | POA: Diagnosis not present

## 2018-09-21 DIAGNOSIS — M9903 Segmental and somatic dysfunction of lumbar region: Secondary | ICD-10-CM | POA: Diagnosis not present

## 2018-09-21 DIAGNOSIS — M545 Low back pain: Secondary | ICD-10-CM | POA: Diagnosis not present

## 2018-09-21 DIAGNOSIS — F411 Generalized anxiety disorder: Secondary | ICD-10-CM | POA: Diagnosis not present

## 2018-09-21 NOTE — Telephone Encounter (Signed)
[facility], received a call from [facility] pharmacy today 09/21/2018. They usually can file for a grant for pts to receive Dificid medication when pt can't afford it. The grant is currently closed and $50.00 is the best they can do. When discussed with the pt on Friday, she was going to get the medication Friday when she gets paid if she continued to have problems with her diarrhea.

## 2018-09-21 NOTE — Progress Notes (Signed)
cc'ed to pcp °

## 2018-09-21 NOTE — Telephone Encounter (Signed)
Noted. Thank you for checking into that!

## 2018-09-30 DIAGNOSIS — M9903 Segmental and somatic dysfunction of lumbar region: Secondary | ICD-10-CM | POA: Diagnosis not present

## 2018-09-30 DIAGNOSIS — M9902 Segmental and somatic dysfunction of thoracic region: Secondary | ICD-10-CM | POA: Diagnosis not present

## 2018-09-30 DIAGNOSIS — M546 Pain in thoracic spine: Secondary | ICD-10-CM | POA: Diagnosis not present

## 2018-09-30 DIAGNOSIS — M545 Low back pain: Secondary | ICD-10-CM | POA: Diagnosis not present

## 2018-10-28 DIAGNOSIS — M546 Pain in thoracic spine: Secondary | ICD-10-CM | POA: Diagnosis not present

## 2018-10-28 DIAGNOSIS — M9903 Segmental and somatic dysfunction of lumbar region: Secondary | ICD-10-CM | POA: Diagnosis not present

## 2018-10-28 DIAGNOSIS — M545 Low back pain: Secondary | ICD-10-CM | POA: Diagnosis not present

## 2018-10-28 DIAGNOSIS — M9902 Segmental and somatic dysfunction of thoracic region: Secondary | ICD-10-CM | POA: Diagnosis not present

## 2018-11-09 ENCOUNTER — Other Ambulatory Visit: Payer: Self-pay | Admitting: Gastroenterology

## 2018-11-27 ENCOUNTER — Encounter: Payer: Self-pay | Admitting: Internal Medicine

## 2018-11-27 ENCOUNTER — Ambulatory Visit (INDEPENDENT_AMBULATORY_CARE_PROVIDER_SITE_OTHER): Payer: BC Managed Care – PPO | Admitting: Internal Medicine

## 2018-11-27 ENCOUNTER — Other Ambulatory Visit: Payer: Self-pay

## 2018-11-27 VITALS — BP 118/69 | HR 75 | Temp 96.9°F | Ht 63.0 in | Wt 114.8 lb

## 2018-11-27 DIAGNOSIS — A09 Infectious gastroenteritis and colitis, unspecified: Secondary | ICD-10-CM | POA: Diagnosis not present

## 2018-11-27 DIAGNOSIS — M9903 Segmental and somatic dysfunction of lumbar region: Secondary | ICD-10-CM | POA: Diagnosis not present

## 2018-11-27 DIAGNOSIS — M9902 Segmental and somatic dysfunction of thoracic region: Secondary | ICD-10-CM | POA: Diagnosis not present

## 2018-11-27 DIAGNOSIS — M546 Pain in thoracic spine: Secondary | ICD-10-CM | POA: Diagnosis not present

## 2018-11-27 DIAGNOSIS — M545 Low back pain: Secondary | ICD-10-CM | POA: Diagnosis not present

## 2018-11-27 NOTE — Patient Instructions (Signed)
May continue daily probiotic   May continue Bentyl 3x daily as needed for abdominal bloating/discomfort  Glad to see you have stopped taking NSAIDs which can hurt your gut  You do have an element of "after infection" irritable bowel syndrome  Office visit with [place] in 6 months  Repeat colonoscopy in 10 years

## 2018-11-27 NOTE — Progress Notes (Signed)
Primary Care Physician:  Benita StabileHall, John Z, MD Primary Gastroenterologist:  Dr. Jena Gaussourk  Pre-Procedure History & Physical: HPI:  Carly Baker is a 48 y.o. female here for follow-up of protracted C. difficile infection.  Ultimately, did not get Dificid .  Finished protracted course of vancomycin.  Has 1 formed bowel movement daily; currently, only occasional diarrhea; no blood per rectum.  Weight stable; appetite good.  She is no longer taking NSAIDs.  She has her own ideas about probiotic -  she is taking 3 different types and feels that it helps.  Intolerant of yogurt. Due for average rescreening colonoscopy in 10 years.  Takes multiple herbal type remedies.  She does take OTCl magnesium daily which is for skeletal muscle relaxation due to her chronic back pain.  Becomes bloated every 3 days or so for which he takes Dicyclomine.   Past Medical History:  Diagnosis Date  . Abdominal cramps 11/07/2014  . Arthritis   . Back pain   . Depressed   . Headache associated with orgasm 11/07/2014  . History of panic attacks 11/07/2014  . Migraine   . Missed periods 11/07/2014  . Sinus infection 11/07/2014  . Vaginal Pap smear, abnormal     Past Surgical History:  Procedure Laterality Date  . BIOPSY  08/10/2018   Procedure: BIOPSY;  Surgeon: Corbin Adeourk, Genavieve Mangiapane M, MD;  [facility]: [facility];  Service: Endoscopy;;  colon  . COLONOSCOPY WITH PROPOFOL N/A 08/10/2018   Procedure: COLONOSCOPY WITH PROPOFOL;  Surgeon: Corbin Adeourk, Arliss Frisina M, MD;  Location: [facility];  Service: Endoscopy;  Laterality: N/A;  12:00pm  . urethra strecthed      Prior to Admission medications   Medication Sig Start Date End Date Taking? Authorizing Provider  acetaminophen (TYLENOL) 500 MG tablet Take 500 mg by mouth every 6 (six) hours as needed for mild pain or headache.   Yes [provider]  Boswellia Serrata (BOSWELLIA PO) Take by mouth daily.   Yes [provider]  carisoprodol (SOMA) 350 MG tablet Take 125 mg by  mouth 4 (four) times daily as needed for muscle spasms.    Yes [provider]  Cholecalciferol (VITAMIN D3) 25 MCG (1000 UT) CAPS Take by mouth 3 (three) times daily.    Yes [provider]  clonazePAM (KLONOPIN) 0.5 MG tablet Take 0.25-0.5 mg by mouth as needed for anxiety (sleep).    Yes [provider]  dicyclomine (BENTYL) 10 MG capsule Take 1 capsule (10 mg total) by mouth 3 (three) times daily before meals. As needed for cramping. 11/09/18  Yes Gelene MinkBoone, Anna W, NP  loratadine (CLARITIN) 10 MG tablet Take 10 mg by mouth at bedtime.    Yes [provider]  Magnesium 250 MG TABS Take 250 mg by mouth daily.   Yes [provider]  Omega-3 Fatty Acids (FISH OIL) 1200 MG CAPS Take by mouth daily.   Yes [provider]  OVER THE COUNTER MEDICATION Leaky Gut Revive 1 scoop daily   Yes [provider]  OVER THE COUNTER MEDICATION Previlli 3 capsules twice daily   Yes [provider]  OVER THE COUNTER MEDICATION Peppermint Oil 1 capsule bid   Yes [provider]  OVER THE COUNTER MEDICATION Intestinal Soothe and Build 3 capsules bid   Yes [provider]  OVER THE COUNTER MEDICATION Slippery Elm 3 capsules bid   Yes [provider]  Potassium 99 MG TABS Take 99 mg by mouth daily.  Yes [provider]  Probiotic Product (FLORASTOR PRE) CAPS Take by mouth daily.   Yes [provider]  Probiotic Product (PROBIOTIC PO) Take 1 tablet by mouth daily.    Yes [provider]  traMADol (ULTRAM) 50 MG tablet Take 50 mg by mouth 2 (two) times daily as needed for moderate pain.    Yes [provider]  TRINTELLIX 10 MG TABS tablet Take 20 mg by mouth at bedtime. 05/25/18  Yes [provider]  TURMERIC PO Take 1 tablet by mouth 2 (two) times daily. Turmeric with Ginger   Yes [provider]  vitamin B-12 (CYANOCOBALAMIN) 100 MCG tablet Take 100 mcg by mouth daily.    Yes [provider]  vitamin C (ASCORBIC ACID) 500 MG tablet Take 1,000 mg by mouth daily.   Yes [provider]  Zinc Sulfate (ZINC-220 PO) Take 220 mg by mouth daily.   Yes [provider]    Allergies as of 11/27/2018 - Review Complete 11/27/2018  Allergen Reaction Noted  . Codeine Nausea And Vomiting 07/31/2018  . Erythromycin Hives 02/29/2012  . Nsaids Other (See Comments) 09/18/2018    Family History  Adopted: Yes  Problem Relation Age of Onset  . Kidney disease Mother        renal failure  . Hodgkin's lymphoma Mother   . Congestive Heart Failure Maternal Grandmother   . Cancer Maternal Grandmother        breast  . Congestive Heart Failure Maternal Grandfather   . Bladder Cancer Maternal Grandfather   . Other Paternal Grandmother        aneursym  . Kidney disease Paternal Grandfather        was on dialysis  . Other Sister        drowned  . Diabetes Sister        Type 1  . Diabetes Sister        Type 1  . Colon cancer Neg Hx   . Inflammatory bowel disease Neg Hx     Social History   Socioeconomic History  . Marital status: Divorced    Spouse name: Not on file  . Number of children: 1  . Years of education: Not on file  . Highest education level: Not on file  Occupational History  . Not on file  Social Needs  . Financial resource strain: Not on file  . Food insecurity    Worry: Not on file    Inability: Not on file  . Transportation needs    Medical: Not on file    Non-medical: Not on file  Tobacco Use  . Smoking status: Former Smoker    Types: Cigarettes  . Smokeless tobacco: Never Used  Substance and Sexual Activity  . Alcohol use: No  . Drug use: No  . Sexual activity: Yes    Birth control/protection: None, Other-see comments, Pill    Comment: vasectomy  Lifestyle  . Physical activity    Days per week: Not on file    Minutes per session: Not on file  . Stress: Not on file  Relationships  . Social Product manager on phone: Not on file    Gets together: Not on file    Attends religious service: Not on file    Active member of club or organization: Not on file    Attends meetings of clubs or organizations: Not on file    Relationship status: Not on file  . Intimate partner  violence    Fear of current or ex partner: Not on file    Emotionally abused: Not on file    Physically abused: Not on file    Forced sexual activity: Not on file  Other Topics Concern  . Not on file  Social History Narrative  . Not on file    Review of Systems: See HPI, otherwise negative ROS  Physical Exam: BP 118/69   Pulse 75   Temp (!) 96.9 F (36.1 C) (Temporal)   Ht 5\' 3"  (1.6 m)   Wt 114 lb 12.8 oz (52.1 kg)   LMP 11/16/2018 (Approximate)   BMI 20.34 kg/m  General:   Alert,  Well-developed, well-nourished, pleasant and cooperative in NAD Abdomen: Non-distended, normal bowel sounds.  Soft and nontender without appreciable mass or hepatosplenomegaly.   Impression/Plan: 48 year old lady with recent protracted C. difficile infection.  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  Glad to see she is no longer taking nonsteroidal agents.  She has her own novel probiotic regimen.  I discussed with her the idea that multiple probiotics are not a typical approach but she feels that this regimen is helping.  Recommendations:   May continue daily probiotic   May continue Bentyl 3x daily as needed for abdominal bloating/discomfort  Continue to avoid nonsteroidal agents.  Office visit with [place] in 6 months  Repeat colonoscopy in 10 years   Notice: This dictation was prepared with Dragon dictation along with smaller phrase technology. Any transcriptional errors that result from this process are unintentional and may not be corrected upon review.

## 2018-11-28 ENCOUNTER — Encounter: Payer: Self-pay | Admitting: Internal Medicine

## 2018-12-04 ENCOUNTER — Other Ambulatory Visit: Payer: Self-pay | Admitting: Gastroenterology

## 2018-12-25 DIAGNOSIS — I482 Chronic atrial fibrillation, unspecified: Secondary | ICD-10-CM | POA: Diagnosis not present

## 2018-12-25 DIAGNOSIS — M5136 Other intervertebral disc degeneration, lumbar region: Secondary | ICD-10-CM | POA: Diagnosis not present

## 2018-12-25 DIAGNOSIS — E782 Mixed hyperlipidemia: Secondary | ICD-10-CM | POA: Diagnosis not present

## 2018-12-25 DIAGNOSIS — R12 Heartburn: Secondary | ICD-10-CM | POA: Diagnosis not present

## 2018-12-25 DIAGNOSIS — E119 Type 2 diabetes mellitus without complications: Secondary | ICD-10-CM | POA: Diagnosis not present

## 2018-12-25 DIAGNOSIS — J45909 Unspecified asthma, uncomplicated: Secondary | ICD-10-CM | POA: Diagnosis not present

## 2018-12-25 DIAGNOSIS — M25559 Pain in unspecified hip: Secondary | ICD-10-CM | POA: Diagnosis not present

## 2018-12-25 DIAGNOSIS — E039 Hypothyroidism, unspecified: Secondary | ICD-10-CM | POA: Diagnosis not present

## 2018-12-25 DIAGNOSIS — F411 Generalized anxiety disorder: Secondary | ICD-10-CM | POA: Diagnosis not present

## 2018-12-25 DIAGNOSIS — I10 Essential (primary) hypertension: Secondary | ICD-10-CM | POA: Diagnosis not present

## 2019-01-07 ENCOUNTER — Other Ambulatory Visit: Payer: Self-pay

## 2019-01-08 MED ORDER — DICYCLOMINE HCL 10 MG PO CAPS: 10.0000 mg | ORAL_CAPSULE | Freq: Three times a day (TID) | ORAL | 3 refills | Status: DC

## 2019-04-06 DIAGNOSIS — E039 Hypothyroidism, unspecified: Secondary | ICD-10-CM | POA: Diagnosis not present

## 2019-04-06 DIAGNOSIS — E559 Vitamin D deficiency, unspecified: Secondary | ICD-10-CM | POA: Diagnosis not present

## 2019-04-06 DIAGNOSIS — E119 Type 2 diabetes mellitus without complications: Secondary | ICD-10-CM | POA: Diagnosis not present

## 2019-04-06 DIAGNOSIS — D509 Iron deficiency anemia, unspecified: Secondary | ICD-10-CM | POA: Diagnosis not present

## 2019-04-06 DIAGNOSIS — I10 Essential (primary) hypertension: Secondary | ICD-10-CM | POA: Diagnosis not present

## 2019-04-06 DIAGNOSIS — E782 Mixed hyperlipidemia: Secondary | ICD-10-CM | POA: Diagnosis not present

## 2019-04-14 DIAGNOSIS — M5136 Other intervertebral disc degeneration, lumbar region: Secondary | ICD-10-CM | POA: Diagnosis not present

## 2019-04-14 DIAGNOSIS — F411 Generalized anxiety disorder: Secondary | ICD-10-CM | POA: Diagnosis not present

## 2019-04-14 DIAGNOSIS — Z0001 Encounter for general adult medical examination with abnormal findings: Secondary | ICD-10-CM | POA: Diagnosis not present

## 2019-04-14 DIAGNOSIS — E782 Mixed hyperlipidemia: Secondary | ICD-10-CM | POA: Diagnosis not present

## 2019-04-15 ENCOUNTER — Other Ambulatory Visit: Payer: Self-pay | Admitting: Nurse Practitioner

## 2019-06-11 DIAGNOSIS — R509 Fever, unspecified: Secondary | ICD-10-CM | POA: Diagnosis not present

## 2019-06-11 DIAGNOSIS — N390 Urinary tract infection, site not specified: Secondary | ICD-10-CM | POA: Diagnosis not present

## 2019-08-10 DIAGNOSIS — M5136 Other intervertebral disc degeneration, lumbar region: Secondary | ICD-10-CM | POA: Diagnosis not present

## 2019-08-10 DIAGNOSIS — M545 Low back pain: Secondary | ICD-10-CM | POA: Diagnosis not present

## 2019-08-10 DIAGNOSIS — G894 Chronic pain syndrome: Secondary | ICD-10-CM | POA: Diagnosis not present

## 2019-08-10 DIAGNOSIS — F411 Generalized anxiety disorder: Secondary | ICD-10-CM | POA: Diagnosis not present

## 2019-09-30 DIAGNOSIS — R110 Nausea: Secondary | ICD-10-CM | POA: Diagnosis not present

## 2019-09-30 DIAGNOSIS — Z886 Allergy status to analgesic agent status: Secondary | ICD-10-CM | POA: Diagnosis not present

## 2019-09-30 DIAGNOSIS — Z881 Allergy status to other antibiotic agents status: Secondary | ICD-10-CM | POA: Diagnosis not present

## 2019-09-30 DIAGNOSIS — Z79899 Other long term (current) drug therapy: Secondary | ICD-10-CM | POA: Diagnosis not present

## 2019-09-30 DIAGNOSIS — R5383 Other fatigue: Secondary | ICD-10-CM | POA: Diagnosis not present

## 2019-09-30 DIAGNOSIS — R519 Headache, unspecified: Secondary | ICD-10-CM | POA: Diagnosis not present

## 2019-09-30 DIAGNOSIS — G43909 Migraine, unspecified, not intractable, without status migrainosus: Secondary | ICD-10-CM | POA: Diagnosis not present

## 2019-09-30 DIAGNOSIS — Z885 Allergy status to narcotic agent status: Secondary | ICD-10-CM | POA: Diagnosis not present

## 2019-10-14 ENCOUNTER — Other Ambulatory Visit: Payer: Self-pay | Admitting: Gastroenterology

## 2019-11-11 DIAGNOSIS — G894 Chronic pain syndrome: Secondary | ICD-10-CM | POA: Diagnosis not present

## 2019-11-11 DIAGNOSIS — M5136 Other intervertebral disc degeneration, lumbar region: Secondary | ICD-10-CM | POA: Diagnosis not present

## 2019-11-11 DIAGNOSIS — F411 Generalized anxiety disorder: Secondary | ICD-10-CM | POA: Diagnosis not present

## 2019-11-11 DIAGNOSIS — M545 Low back pain: Secondary | ICD-10-CM | POA: Diagnosis not present

## 2020-01-08 ENCOUNTER — Encounter: Payer: Self-pay | Admitting: Family Medicine

## 2020-01-08 ENCOUNTER — Other Ambulatory Visit: Payer: Self-pay

## 2020-01-08 ENCOUNTER — Emergency Department
Admission: EM | Admit: 2020-01-08 | Discharge: 2020-01-08 | Disposition: A | Discharge status: Home / Self Care | Payer: BC Managed Care – PPO | Source: Home / Self Care

## 2020-01-08 DIAGNOSIS — N3090 Cystitis, unspecified without hematuria: Secondary | ICD-10-CM

## 2020-01-08 DIAGNOSIS — R300 Dysuria: Secondary | ICD-10-CM

## 2020-01-08 LAB — POCT URINALYSIS DIP (MANUAL ENTRY)
Bilirubin, UA: NEGATIVE
Glucose, UA: NEGATIVE mg/dL
Ketones, POC UA: NEGATIVE mg/dL
Nitrite, UA: NEGATIVE
Protein Ur, POC: 30 mg/dL — AB
Spec Grav, UA: 1.015 (ref 1.010–1.025)
Urobilinogen, UA: 0.2 E.U./dL
pH, UA: 7.5 (ref 5.0–8.0)

## 2020-01-08 MED ORDER — CEPHALEXIN 500 MG PO CAPS: 500.0000 mg | ORAL_CAPSULE | Freq: Three times a day (TID) | ORAL | 0 refills | Status: DC

## 2020-01-08 NOTE — ED Triage Notes (Signed)
Patient here with day 4 of dysuria; yesterday also had a migraine; is currently perimenopausal and is menstruating today. No OTCs today. Has not had covid vaccination.

## 2020-01-08 NOTE — ED Provider Notes (Signed)
[facility]    CSN: [id number] Arrival date & time: 01/08/20  0845      History   Chief Complaint Chief Complaint  Patient presents with  . Dysuria    HPI Carly Baker is a 49 y.o. female.   This is the initial Carly Baker urgent care visit for this 49 year old woman complaining of symptoms consistent with UTI.  She has had symptoms for about 4 days but they became much worse today.  She has continuous burning.  She developed her menstrual period a week early yesterday which was associated with a headache and nausea and vomiting.  She has been having more headaches recently which she attributes to hormonal changes and her established diagnosis of migraine headache.  Patient is also being treated for low back pain and spinal stenosis with referred hip pain.       Past Medical History:  Diagnosis Date  . Abdominal cramps 11/07/2014  . Arthritis   . Back pain   . Depressed   . Headache associated with orgasm 11/07/2014  . History of panic attacks 11/07/2014  . Migraine   . Missed periods 11/07/2014  . Sinus infection 11/07/2014  . Vaginal Pap smear, abnormal     Patient Active Problem List   Diagnosis Date Noted  . Enteritis due to Clostridium difficile 09/18/2018  . LLQ pain 06/15/2018  . Rectal bleeding 06/15/2018  . Bowel habit changes 06/15/2018  . Hemorrhoids 10/29/2017  . Screening for colorectal cancer 10/29/2017  . Menorrhagia with regular cycle 10/06/2017  . Vaginal discharge 10/06/2017  . BV (bacterial vaginosis) 10/06/2017  . Missed periods 11/07/2014  . Headache associated with orgasm 11/07/2014  . Sinus infection 11/07/2014  . Abdominal cramps 11/07/2014  . History of panic attacks 11/07/2014  . Back pain 12/22/2013    Past Surgical History:  Procedure Laterality Date  . BIOPSY  08/10/2018   Procedure: BIOPSY;  Surgeon: Corbin Ade, MD;  [facility];  Service: Endoscopy;;  colon  . COLONOSCOPY WITH PROPOFOL N/A 08/10/2018    Procedure: COLONOSCOPY WITH PROPOFOL;  Surgeon: Corbin Ade, MD;  Location: [facility];  Service: Endoscopy;  Laterality: N/A;  12:00pm  . urethra strecthed      OB History    Gravida  1   Para  1   Term      Preterm      AB      Living  1     SAB      TAB      Ectopic      Multiple      Live Births               Home Medications    Prior to Admission medications   Medication Sig Start Date End Date Taking? Authorizing Provider  acetaminophen (TYLENOL) 500 MG tablet Take 500 mg by mouth every 6 (six) hours as needed for mild pain or headache.    [provider]  Jeanie Cooks Serrata (BOSWELLIA PO) Take by mouth daily.    [provider]  carisoprodol (SOMA) 350 MG tablet Take 125 mg by mouth 4 (four) times daily as needed for muscle spasms.     [provider]  cephALEXin (KEFLEX) 500 MG capsule Take 1 capsule (500 mg total) by mouth 3 (three) times daily. 01/08/20   Elvina Sidle, MD  Cholecalciferol (VITAMIN D3) 25 MCG (1000 UT) CAPS Take by mouth 3 (three) times daily.     [provider]  clonazePAM (KLONOPIN) 0.5 MG tablet Take 0.25-0.5 mg by mouth as needed for anxiety (sleep).     [provider]  dicyclomine (BENTYL) 10 MG capsule TAKE 1 CAPSULE (10 MG TOTAL) BY MOUTH 3 (THREE) TIMES DAILY BEFORE MEALS. AS NEEDED FOR CRAMPING. 10/15/19   Anice PaganiniGill, Eric A, NP  loratadine (CLARITIN) 10 MG tablet Take 10 mg by mouth at bedtime.     [provider]  Magnesium 250 MG TABS Take 250 mg by mouth daily.    [provider]  Omega-3 Fatty Acids (FISH OIL) 1200 MG CAPS Take by mouth daily.    [provider]  OVER THE COUNTER MEDICATION Leaky Gut Revive 1 scoop daily    [provider]  OVER THE COUNTER MEDICATION Previlli 3 capsules twice daily    [provider]  OVER THE COUNTER MEDICATION Peppermint Oil 1 capsule bid    [provider]  OVER THE COUNTER MEDICATION  Intestinal Soothe and Build 3 capsules bid    [provider]  OVER THE COUNTER MEDICATION Slippery Elm 3 capsules bid    [provider]  Potassium 99 MG TABS Take 99 mg by mouth daily.    [provider]  Probiotic Product (FLORASTOR PRE) CAPS Take by mouth daily.    [provider]  Probiotic Product (PROBIOTIC PO) Take 1 tablet by mouth daily.     [provider]  traMADol (ULTRAM) 50 MG tablet Take 50 mg by mouth 2 (two) times daily as needed for moderate pain.     [provider]  TRINTELLIX 10 MG TABS tablet Take 20 mg by mouth at bedtime. 05/25/18   [provider]  TURMERIC PO Take 1 tablet by mouth 2 (two) times daily. Turmeric with Ginger    [provider]  vitamin B-12 (CYANOCOBALAMIN) 100 MCG tablet Take 100 mcg by mouth daily.    [provider]  vitamin C (ASCORBIC ACID) 500 MG tablet Take 1,000 mg by mouth daily.    [provider]  Zinc Sulfate (ZINC-220 PO) Take 220 mg by mouth daily.    [provider]    Family History Family History  Adopted: Yes  Problem Relation Age of Onset  . Kidney disease Mother        renal failure  . Hodgkin's lymphoma Mother   . Congestive Heart Failure Maternal Grandmother   . Cancer Maternal Grandmother        breast  . Congestive Heart Failure Maternal Grandfather   . Bladder Cancer Maternal Grandfather   . Other Paternal Grandmother        aneursym  . Kidney disease Paternal Grandfather        was on dialysis  . Other Sister        drowned  . Diabetes Sister        Type 1  . Diabetes Sister        Type 1  . Colon cancer Neg Hx   . Inflammatory bowel disease Neg Hx     Social History Social History   Tobacco Use  . Smoking status: Former Smoker    Types: Cigarettes  . Smokeless tobacco: Never Used  Vaping Use  . Vaping Use: Never used  Substance Use Topics  . Alcohol use: No  . Drug use: No     Allergies   Codeine,  Erythromycin, and Nsaids   Review of Systems Review of Systems  Constitutional: Negative.  Gastrointestinal: Positive for nausea and vomiting.  Genitourinary: Positive for dysuria.  Musculoskeletal: Positive for back pain and gait problem.  Neurological: Positive for headaches.     Physical Exam Triage Vital Signs ED Triage Vitals  Enc Vitals Group     BP      Pulse      Resp      Temp      Temp src      SpO2      Weight      Height      Head Circumference      Peak Flow      Pain Score      Pain Loc      Pain Edu?      Excl. in GC?    No data found.  Updated Vital Signs BP (!) 150/100 (BP Location: Right Arm)   Pulse 71   Temp 98.2 F (36.8 C) (Oral)   Resp 16   Ht 5\' 3"  (1.6 m)   Wt 52.6 kg   SpO2 99%   BMI 20.55 kg/m    Physical Exam Vitals and nursing note reviewed.  Constitutional:      General: She is not in acute distress.    Appearance: Normal appearance. She is normal weight. She is not ill-appearing.  HENT:     Head: Normocephalic.  Eyes:     Conjunctiva/sclera: Conjunctivae normal.  Pulmonary:     Effort: Pulmonary effort is normal.  Musculoskeletal:        General: Normal range of motion.     Cervical back: Normal range of motion and neck supple.  Skin:    General: Skin is warm.  Neurological:     General: No focal deficit present.     Mental Status: She is alert.  Psychiatric:        Mood and Affect: Mood normal.        Thought Content: Thought content normal.      UC Treatments / Results  Labs (all labs ordered are listed, but only abnormal results are displayed) Labs Reviewed  POCT URINALYSIS DIP (MANUAL ENTRY) - Abnormal; Notable for the following components:      Result Value   Color, UA light yellow (*)    Clarity, UA cloudy (*)    Blood, UA large (*)    Protein Ur, POC =30 (*)    Leukocytes, UA Moderate (2+) (*)    All other components within normal limits  URINE CULTURE    EKG   Radiology No results  found.  Procedures Procedures (including critical care time)  Medications Ordered in UC Medications - No data to display  Initial Impression / Assessment and Plan / UC Course  I have reviewed the triage vital signs and the nursing notes.  Pertinent labs & imaging results that were available during my care of the patient were reviewed by me and considered in my medical decision making (see chart for details).    Final Clinical Impressions(s) / UC Diagnoses   Final diagnoses:  Dysuria  Cystitis     Discharge Instructions     You're running a urine culture and will notify you if you need to change the antibiotic.  Usually this antibiotic starts relieving symptoms in about 12 hours and the symptoms are completely controlled after 48 hours.  You can take Pyridium over-the-counter to help control the burning while this antibiotic is working.  The Pyridium will turn your urine bright orange.  Ask your personal  physician about monoclonal antibodies to help control your migraine headaches.    ED Prescriptions    Medication Sig Dispense Auth. Provider   cephALEXin (KEFLEX) 500 MG capsule Take 1 capsule (500 mg total) by mouth 3 (three) times daily. 21 capsule Elvina Sidle, MD     I have reviewed the PDMP during this encounter.   Elvina Sidle, MD 01/08/20 [phone]

## 2020-01-08 NOTE — Discharge Instructions (Addendum)
You're running a urine culture and will notify you if you need to change the antibiotic.  Usually this antibiotic starts relieving symptoms in about 12 hours and the symptoms are completely controlled after 48 hours.  You can take Pyridium over-the-counter to help control the burning while this antibiotic is working.  The Pyridium will turn your urine bright orange.  Ask your personal physician about monoclonal antibodies to help control your migraine headaches.

## 2020-01-09 LAB — URINE CULTURE
MICRO NUMBER:: 11054173
SPECIMEN QUALITY:: ADEQUATE

## 2020-04-24 ENCOUNTER — Other Ambulatory Visit: Payer: Self-pay | Admitting: Nurse Practitioner

## 2020-05-12 DIAGNOSIS — M816 Localized osteoporosis [Lequesne]: Secondary | ICD-10-CM | POA: Diagnosis not present

## 2020-05-12 DIAGNOSIS — E782 Mixed hyperlipidemia: Secondary | ICD-10-CM | POA: Diagnosis not present

## 2020-05-12 DIAGNOSIS — I10 Essential (primary) hypertension: Secondary | ICD-10-CM | POA: Diagnosis not present

## 2020-05-12 DIAGNOSIS — J45909 Unspecified asthma, uncomplicated: Secondary | ICD-10-CM | POA: Diagnosis not present

## 2020-05-12 DIAGNOSIS — R12 Heartburn: Secondary | ICD-10-CM | POA: Diagnosis not present

## 2020-05-12 DIAGNOSIS — D509 Iron deficiency anemia, unspecified: Secondary | ICD-10-CM | POA: Diagnosis not present

## 2020-05-12 DIAGNOSIS — E039 Hypothyroidism, unspecified: Secondary | ICD-10-CM | POA: Diagnosis not present

## 2020-05-12 DIAGNOSIS — E119 Type 2 diabetes mellitus without complications: Secondary | ICD-10-CM | POA: Diagnosis not present

## 2020-05-12 DIAGNOSIS — E559 Vitamin D deficiency, unspecified: Secondary | ICD-10-CM | POA: Diagnosis not present

## 2020-07-05 DIAGNOSIS — R42 Dizziness and giddiness: Secondary | ICD-10-CM | POA: Diagnosis not present

## 2020-07-05 DIAGNOSIS — Z0001 Encounter for general adult medical examination with abnormal findings: Secondary | ICD-10-CM | POA: Diagnosis not present

## 2020-07-05 DIAGNOSIS — G43009 Migraine without aura, not intractable, without status migrainosus: Secondary | ICD-10-CM | POA: Diagnosis not present

## 2020-07-07 DIAGNOSIS — M62830 Muscle spasm of back: Secondary | ICD-10-CM | POA: Diagnosis not present

## 2020-07-07 DIAGNOSIS — M546 Pain in thoracic spine: Secondary | ICD-10-CM | POA: Diagnosis not present

## 2020-07-07 DIAGNOSIS — M9903 Segmental and somatic dysfunction of lumbar region: Secondary | ICD-10-CM | POA: Diagnosis not present

## 2020-07-07 DIAGNOSIS — M9902 Segmental and somatic dysfunction of thoracic region: Secondary | ICD-10-CM | POA: Diagnosis not present

## 2020-08-31 DIAGNOSIS — G43009 Migraine without aura, not intractable, without status migrainosus: Secondary | ICD-10-CM | POA: Diagnosis not present

## 2020-10-26 DIAGNOSIS — K589 Irritable bowel syndrome without diarrhea: Secondary | ICD-10-CM | POA: Diagnosis not present

## 2020-10-26 DIAGNOSIS — F411 Generalized anxiety disorder: Secondary | ICD-10-CM | POA: Diagnosis not present

## 2020-10-26 DIAGNOSIS — M545 Low back pain, unspecified: Secondary | ICD-10-CM | POA: Diagnosis not present

## 2020-10-26 DIAGNOSIS — G43909 Migraine, unspecified, not intractable, without status migrainosus: Secondary | ICD-10-CM | POA: Diagnosis not present

## 2021-02-06 ENCOUNTER — Other Ambulatory Visit: Payer: Self-pay

## 2021-02-06 ENCOUNTER — Ambulatory Visit (INDEPENDENT_AMBULATORY_CARE_PROVIDER_SITE_OTHER): Payer: Managed Care, Other (non HMO) | Admitting: Adult Health

## 2021-02-06 ENCOUNTER — Encounter: Payer: Self-pay | Admitting: Adult Health

## 2021-02-06 ENCOUNTER — Other Ambulatory Visit (HOSPITAL_COMMUNITY)
Admission: RE | Admit: 2021-02-06 | Discharge: 2021-02-06 | Disposition: A | Discharge status: Home / Self Care | Payer: Managed Care, Other (non HMO) | Source: Ambulatory Visit | Attending: Adult Health | Admitting: Adult Health

## 2021-02-06 VITALS — BP 125/77 | HR 75 | Ht 63.0 in | Wt 123.0 lb

## 2021-02-06 DIAGNOSIS — Z01419 Encounter for gynecological examination (general) (routine) without abnormal findings: Secondary | ICD-10-CM

## 2021-02-06 DIAGNOSIS — R4589 Other symptoms and signs involving emotional state: Secondary | ICD-10-CM

## 2021-02-06 DIAGNOSIS — R110 Nausea: Secondary | ICD-10-CM | POA: Diagnosis not present

## 2021-02-06 DIAGNOSIS — F419 Anxiety disorder, unspecified: Secondary | ICD-10-CM

## 2021-02-06 DIAGNOSIS — N951 Menopausal and female climacteric states: Secondary | ICD-10-CM | POA: Diagnosis not present

## 2021-02-06 DIAGNOSIS — F32A Depression, unspecified: Secondary | ICD-10-CM

## 2021-02-06 MED ORDER — ONDANSETRON 4 MG PO TBDP: 4.0000 mg | ORAL_TABLET | Freq: Three times a day (TID) | ORAL | 1 refills | Status: DC | PRN

## 2021-02-06 MED ORDER — CLIMARA PRO 0.045-0.015 MG/DAY TD PTWK
1.0000 | MEDICATED_PATCH | TRANSDERMAL | 12 refills | Status: DC
Start: 2021-02-06 — End: 2021-02-08

## 2021-02-06 NOTE — Progress Notes (Signed)
Patient ID: Carly Baker, female   DOB: 12/12/1970, 50 y.o.   MRN: [id number] History of Present Illness: Carly is a 50 year old white female,divorced, G1P1 in for well woman gyn exam and pap. She is having nausea,forgetful,hot flashes and not sleeping well and irregular periods. PCP is Dr Margo Aye.   Current Medications, Allergies, Past Medical History, Past Surgical History, Family History and Social History were reviewed in [employer] record.     Review of Systems: Patient denies any headaches, hearing loss, fatigue, blurred vision, shortness of breath, chest pain, abdominal pain, problems with bowel movements, urination, or intercourse. No joint pain. See HPI for positives.     Physical Exam:BP 125/77 (BP Location: Left Arm, Patient Position: Sitting, Cuff Size: Normal)   Pulse 75   Ht 5\' 3"  (1.6 m)   Wt 123 lb (55.8 kg)   LMP 01/15/2021   BMI 21.79 kg/m   General:  Well developed, well nourished, no acute distress Skin:  Warm and dry Neck:  Midline trachea, normal thyroid, good ROM, no lymphadenopathy Lungs; Clear to auscultation bilaterally Breast:  No dominant palpable mass, retraction, or nipple discharge Cardiovascular: Regular rate and rhythm Abdomen:  Soft, non tender, no hepatosplenomegaly Pelvic:  External genitalia is normal in appearance, no lesions.  The vagina is normal in appearance. Urethra has no lesions or masses. The cervix is smooth, pap with HR HPV genotyping performed.  Uterus is felt to be normal size, shape, and contour.  No adnexal masses or tenderness noted.Bladder is non tender, no masses felt. Rectal: Deferred. Extremities/musculoskeletal:  No swelling or varicosities noted, no clubbing or cyanosis Psych:  No mood changes, alert and cooperative,seems happy AA is 1 Fall risk is low Depression screen [facility] 2/9 02/06/2021 12/24/2017 10/29/2017  Decreased Interest 3 1 1   Down, Depressed, Hopeless 3 0 1  PHQ - 2 Score 6 1 2   Altered  sleeping 3 - 1  Tired, decreased energy 3 - 2  Change in appetite 3 - -  Feeling bad or failure about yourself  3 - 0  Trouble concentrating 3 - 1  Moving slowly or fidgety/restless 0 - 1  Suicidal thoughts 0 - 0  PHQ-9 Score 21 - 7  Difficult doing work/chores - - Somewhat difficult   On trintellis GAD 7 : Generalized Anxiety Score 02/06/2021  Nervous, Anxious, on Edge 3  Control/stop worrying 3  Worry too much - different things 3  Trouble relaxing 3  Restless 0  Easily annoyed or irritable 3  Afraid - awful might happen 3  Total GAD 7 Score 18    Upstream - 02/06/21 1457       Pregnancy Intention Screening   Does the patient want to become pregnant in the next year? No    Does the patient's partner want to become pregnant in the next year? No    Would the patient like to discuss contraceptive options today? No      Contraception Wrap Up   Current Method Vasectomy    End Method Vasectomy    Contraception Counseling Provided No              Examination chaperoned by LPN.   Impression and Plan: 1. Encounter for gynecological examination with Papanicolaou smear of cervix Pap sent Physical in 1 year Pap in 3 if normal Get mammogram  Colonscopy per GI - Cytology - PAP( [facility])  2. Menopausal symptoms She denies, MI,stroke, DVT, breast cancer Will try  HRT Meds ordered this encounter  Medications   estradiol-levonorgestrel (CLIMARA PRO) 0.045-0.015 MG/DAY    Sig: Place 1 patch onto the skin once a week.    Dispense:  4 patch    Refill:  12    Order Specific Question:   Supervising Provider    Answer:   Duane Lope H [[id number]]   ondansetron (ZOFRAN ODT) 4 MG disintegrating tablet    Sig: Take 1 tablet (4 mg total) by mouth every 8 (eight) hours as needed for nausea or vomiting.    Dispense:  20 tablet    Refill:  1    Order Specific Question:   Supervising Provider    Answer:   Despina Hidden, LUTHER H [[id number]]     3. Nausea Will rx zofran  4.  Anxiety and depression Follow up with PCP  5. [employer]

## 2021-02-08 ENCOUNTER — Other Ambulatory Visit: Payer: Self-pay | Admitting: Adult Health

## 2021-02-09 LAB — CYTOLOGY - PAP
Comment: NEGATIVE
Diagnosis: NEGATIVE
High risk HPV: NEGATIVE

## 2021-02-12 ENCOUNTER — Other Ambulatory Visit: Payer: Self-pay | Admitting: Adult Health

## 2021-02-12 MED ORDER — LO LOESTRIN FE 1 MG-10 MCG / 10 MCG PO TABS: 1.0000 | ORAL_TABLET | Freq: Every day | ORAL | 6 refills | Status: DC

## 2021-02-12 NOTE — Progress Notes (Signed)
Will rx lo loestrin  

## 2021-05-16 ENCOUNTER — Encounter: Payer: Self-pay | Admitting: Adult Health

## 2021-05-16 ENCOUNTER — Ambulatory Visit (INDEPENDENT_AMBULATORY_CARE_PROVIDER_SITE_OTHER): Payer: Managed Care, Other (non HMO) | Admitting: Adult Health

## 2021-05-16 ENCOUNTER — Other Ambulatory Visit: Payer: Self-pay

## 2021-05-16 VITALS — BP 145/83 | HR 63 | Ht 63.0 in | Wt 116.8 lb

## 2021-05-16 DIAGNOSIS — N926 Irregular menstruation, unspecified: Secondary | ICD-10-CM

## 2021-05-16 DIAGNOSIS — R232 Flushing: Secondary | ICD-10-CM

## 2021-05-16 DIAGNOSIS — R4589 Other symptoms and signs involving emotional state: Secondary | ICD-10-CM | POA: Diagnosis not present

## 2021-05-16 DIAGNOSIS — R110 Nausea: Secondary | ICD-10-CM

## 2021-05-16 DIAGNOSIS — N951 Menopausal and female climacteric states: Secondary | ICD-10-CM

## 2021-05-16 MED ORDER — NORETHIN ACE-ETH ESTRAD-FE 1-20 MG-MCG PO TABS: 1.0000 | ORAL_TABLET | Freq: Every day | ORAL | 11 refills | Status: DC

## 2021-05-16 NOTE — Progress Notes (Signed)
°  Subjective:     Patient ID: Carly Baker, female   DOB: 03-25-1971, 51 y.o.   MRN: [id number]  HPI Carly is a 51 year old white female,divorced, G1P1, back in follow up on starting lo Loestrin in November for menopausal symptoms, and not any better. Her dad died 2021-04-08 and she recently broke up with boyfriend he was cheating and she move in with her sister, who is a negative person.  Lab Results  Component Value Date   DIAGPAP  02/06/2021    - Negative for intraepithelial lesion or malignancy (NILM)   HPV NOT DETECTED 10/29/2017   [place] Negative 02/06/2021   PCP is Dr Nevada Crane.  Review of Systems +irregular bleeding +hot flashes Isac Sarna +nausea Not sleeping well  Reviewed past medical,surgical, social and family history. Reviewed medications and allergies.     Objective:   Physical Exam BP (!) 145/83 (BP Location: Right Arm, Patient Position: Sitting, Cuff Size: Normal)    Pulse 63    Ht 5\' 3"  (1.6 m)    Wt 116 lb 12.8 oz (53 kg)    LMP 05/13/2021 (Exact Date)    BMI 20.69 kg/m     Skin warm and dry. Lungs: clear to ausculation bilaterally. Cardiovascular: regular rate and rhythm.   Upstream - 05/16/21 1553       Pregnancy Intention Screening   Does the patient want to become pregnant in the next year? No    Does the patient's partner want to become pregnant in the next year? No    Would the patient like to discuss contraceptive options today? Yes      Contraception Wrap Up   Current Method Oral Contraceptive    End Method Oral Contraceptive    Contraception Counseling Provided Yes             Assessment:     1. Irregular periods Elizebeth Koller out current pack of lo Loestrin and will start Junel 1/20, if has sex use condoms Meds ordered this encounter  Medications   norethindrone-ethinyl estradiol-FE (LOESTRIN FE) 1-20 MG-MCG tablet    Sig: Take 1 tablet by mouth daily.    Dispense:  28 tablet    Refill:  11    Order Specific Question:   Supervising  Provider    Answer:   Tania Ade H [[id number]]     2. Hot flashes Continue COC  3. Menopausal symptoms Continue COC  4. Moody She is on trintellix   5. Nausea She has zofran ODT    Plan:     Follow up in 3 months

## 2021-08-13 ENCOUNTER — Ambulatory Visit (INDEPENDENT_AMBULATORY_CARE_PROVIDER_SITE_OTHER): Payer: Managed Care, Other (non HMO) | Admitting: Adult Health

## 2021-08-13 ENCOUNTER — Encounter: Payer: Self-pay | Admitting: Adult Health

## 2021-08-13 DIAGNOSIS — N926 Irregular menstruation, unspecified: Secondary | ICD-10-CM

## 2021-08-13 DIAGNOSIS — R4589 Other symptoms and signs involving emotional state: Secondary | ICD-10-CM | POA: Diagnosis not present

## 2021-08-13 DIAGNOSIS — N951 Menopausal and female climacteric states: Secondary | ICD-10-CM | POA: Diagnosis not present

## 2021-08-13 DIAGNOSIS — R232 Flushing: Secondary | ICD-10-CM

## 2021-08-13 NOTE — Progress Notes (Signed)
Patient ID: Carly Baker, female   DOB: [birth year], 51 y.o.   MRN: [id number] ? ? ?TELEHEALTH GYNECOLOGY VISIT ENCOUNTER NOTE ? ?Provider location: [facility] [employer] [facility]  ? ?Patient location: Home ? ?I connected with Carly Baker on 08/13/21 at  2:50 PM EDT by telephone and verified that I am speaking with the correct person using two identifiers. Patient was unable to do MyChart audiovisual encounter due to technical difficulties, she tried several times.  ?  ?I discussed the limitations, risks, security and privacy concerns of performing an evaluation and management service by telephone and the availability of in person appointments. I also discussed with the patient that there may be a patient responsible charge related to this service. The patient expressed understanding and agreed to proceed. ?  ?History:  ?Carly Baker is a 51 y.o. G53P1001 female being evaluated today for follow up on taking Loestrin 1/20 for menopausal symptoms like hot flashes,irregular bleeding and moody and is better.  She started therapy today, and is still on trintellix. She denies any abnormal vaginal discharge, bleeding, pelvic pain or other concerns.   ?  ?  ?Past Medical History:  ?Diagnosis Date  ? Abdominal cramps 11/07/2014  ? Arthritis   ? Back pain   ? Depressed   ? Headache associated with orgasm 11/07/2014  ? History of panic attacks 11/07/2014  ? Migraine   ? Missed periods 11/07/2014  ? Sinus infection 11/07/2014  ? Vaginal Pap smear, abnormal   ? ?Past Surgical History:  ?Procedure Laterality Date  ? BIOPSY  08/10/2018  ? Procedure: BIOPSY;  Surgeon: Corbin Ade, MD;  [facility];  Service: Endoscopy;;  colon  ? COLONOSCOPY WITH PROPOFOL N/A 08/10/2018  ? Procedure: COLONOSCOPY WITH PROPOFOL;  Surgeon: Corbin Ade, MD;  [facility]: [facility];  Service: Endoscopy;  Laterality: N/A;  12:00pm  ? urethra strecthed    ? ?The following portions of the patient's history were reviewed and  updated as appropriate: allergies, current medications, past family history, past medical history, past social history, past surgical history and problem list.  ? ?Health Maintenance:  Normal pap and negative HRHPV  ?Lab Results  ?Component Value Date  ? DIAGPAP  02/06/2021  ?  - Negative for intraepithelial lesion or malignancy (NILM)  ? HPV NOT DETECTED 10/29/2017  ? HPVHIGH Negative 02/06/2021  ?  ? ?Review of Systems:  ?Pertinent items noted in HPI and remainder of comprehensive ROS otherwise negative. ? ?Physical Exam:  ? ?General:  Alert, oriented and cooperative.   ?Mental Status: Normal mood and affect perceived. Normal judgment and thought content.  ?Physical exam deferred due to nature of the encounter ? Upstream - 08/13/21 1517   ? ?  ? Pregnancy Intention Screening  ? Does the patient want to become pregnant in the next year? No   ? Does the patient's partner want to become pregnant in the next year? No   ? Would the patient like to discuss contraceptive options today? No   ?  ? Contraception Wrap Up  ? Current Method Oral Contraceptive;Abstinence   ? End Method Oral Contraceptive;Abstinence   ? Contraception Counseling Provided No   ? ?  ?  ? ?  ?  ?Labs and Imaging ?No results found for this or any previous visit (from the past 336 hour(s)). ?No results found.    ?Assessment and Plan:  ?   ?1. Irregular periods ?Continue Loestrin 1/20, 1 daily, has  refills ? ?2. Hot flashes ?Continue Loestrin 1/20 ? ?3. Moody ?Continue Loestrin 1/20 and therapy ? ?4. Menopausal symptoms ?Will continue Loestrin 1/20, has refills  ?Return in November for physical  ?   ?  ?I discussed the assessment and treatment plan with the patient. The patient was provided an opportunity to ask questions and all were answered. The patient agreed with the plan and demonstrated an understanding of the instructions. ?  ?The patient was advised to call back or seek an in-person evaluation/go to the ED if the symptoms worsen or if the  condition fails to improve as anticipated. ? ?I provided 10  minutes of non-face-to-face time during this encounter. ?I was in my office at [facility] tree for this encounter  ? ?Cyril Mourning, NP ?[facility] [employer], [facility]  ?

## 2021-10-04 ENCOUNTER — Ambulatory Visit (HOSPITAL_COMMUNITY)
Admission: RE | Admit: 2021-10-04 | Discharge: 2021-10-04 | Disposition: A | Discharge status: Home / Self Care | Payer: Managed Care, Other (non HMO) | Source: Ambulatory Visit | Attending: Nurse Practitioner | Admitting: Nurse Practitioner

## 2021-10-04 ENCOUNTER — Other Ambulatory Visit (HOSPITAL_COMMUNITY): Payer: Self-pay | Admitting: Nurse Practitioner

## 2021-10-04 DIAGNOSIS — M545 Low back pain, unspecified: Secondary | ICD-10-CM

## 2021-10-24 ENCOUNTER — Other Ambulatory Visit: Payer: Self-pay | Admitting: Adult Health

## 2022-02-04 DIAGNOSIS — S93601A Unspecified sprain of right foot, initial encounter: Secondary | ICD-10-CM | POA: Insufficient documentation

## 2022-02-06 DIAGNOSIS — M79671 Pain in right foot: Secondary | ICD-10-CM | POA: Insufficient documentation

## 2022-02-07 DIAGNOSIS — S9001XA Contusion of right ankle, initial encounter: Secondary | ICD-10-CM | POA: Insufficient documentation

## 2022-03-20 ENCOUNTER — Other Ambulatory Visit: Payer: Self-pay | Admitting: Adult Health

## 2022-04-09 ENCOUNTER — Ambulatory Visit: Payer: Managed Care, Other (non HMO) | Admitting: Neurology

## 2022-05-21 ENCOUNTER — Encounter: Payer: Self-pay | Admitting: Neurology

## 2022-05-21 ENCOUNTER — Ambulatory Visit (INDEPENDENT_AMBULATORY_CARE_PROVIDER_SITE_OTHER): Payer: Managed Care, Other (non HMO) | Admitting: Neurology

## 2022-05-21 ENCOUNTER — Telehealth: Payer: Self-pay | Admitting: Neurology

## 2022-05-21 VITALS — BP 108/75 | HR 68 | Ht 63.0 in

## 2022-05-21 DIAGNOSIS — G43109 Migraine with aura, not intractable, without status migrainosus: Secondary | ICD-10-CM | POA: Insufficient documentation

## 2022-05-21 DIAGNOSIS — R269 Unspecified abnormalities of gait and mobility: Secondary | ICD-10-CM

## 2022-05-21 DIAGNOSIS — M542 Cervicalgia: Secondary | ICD-10-CM | POA: Insufficient documentation

## 2022-05-21 DIAGNOSIS — G43E09 Chronic migraine with aura, not intractable, without status migrainosus: Secondary | ICD-10-CM | POA: Diagnosis not present

## 2022-05-21 DIAGNOSIS — G43709 Chronic migraine without aura, not intractable, without status migrainosus: Secondary | ICD-10-CM | POA: Insufficient documentation

## 2022-05-21 DIAGNOSIS — R292 Abnormal reflex: Secondary | ICD-10-CM

## 2022-05-21 MED ORDER — DIVALPROEX SODIUM ER 500 MG PO TB24: 500.0000 mg | ORAL_TABLET | Freq: Every day | ORAL | 11 refills | Status: DC

## 2022-05-21 MED ORDER — RIZATRIPTAN BENZOATE 10 MG PO TBDP: 10.0000 mg | ORAL_TABLET | ORAL | 11 refills | Status: DC | PRN

## 2022-05-21 MED ORDER — ONDANSETRON HCL 4 MG PO TABS: 4.0000 mg | ORAL_TABLET | Freq: Three times a day (TID) | ORAL | 6 refills | Status: DC | PRN

## 2022-05-21 MED ORDER — TIZANIDINE HCL 4 MG PO TABS: 4.0000 mg | ORAL_TABLET | Freq: Four times a day (QID) | ORAL | 6 refills | Status: DC | PRN

## 2022-05-21 NOTE — Telephone Encounter (Signed)
[employer] sent to GI they obtain auth [id number]

## 2022-05-21 NOTE — Progress Notes (Signed)
Chief Complaint  Patient presents with   New Patient (Initial Visit)    Rm 17. Alone. 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 C/o migraines related to weather changes. She gets 2-3 migraines before menstrual cycle and 2-3 during her menstrual cycle. Triggered by heat, anxiety.     ASSESSMENT AND PLAN  Carly Baker is a 52 y.o. female   Chronic migraine headache Depression anxiety  Start preventive medication Depakote ER 500 mg every night,  Maxalt 10 mg as needed, may combine with Tylenol, tizanidine, Zofran, for prolonged severe headache,  Worsening urinary incontinence, neck pain,  Hyperreflexia on examinations  MRI of cervical spine to rule out cervical spondylitic myelopathy/cervical radiculopathy  Return To Clinic With NP In 3  Months   DIAGNOSTIC DATA (LABS, IMAGING, TESTING) - I reviewed patient records, labs, notes, testing and imaging myself where available.   MEDICAL HISTORY:  Carly Baker, is a 52 year old female seen in request by Dr. [place] Baker, Carly Baker for evaluation of frequent headaches initial evaluation was on May 21, 2022  I reviewed and summarized the referring note.PMHX. Depression, Anxiety Right foot injury on Nov 6th 2023,   She reported long history of chronic migraines since 20s, frequent flareup at that time, improved in her late 45s, began to have frequent headaches again around 2022, she is going through menopause, unfortunately also suffered right foot injury in November 2023, still working crutches, this has caused worsening depression and anxiety  She has migraine headache up to 2-3 times each week, sometimes proceeding by visual aura lasting 10 to 15 minutes, then lateralized severe pounding headache with light, noise sensitivity, nauseous, neck tension, lasting few hours to few days, she has tried [employer], [employer], migrinal nasal spray with limited help, not a candidate for NSAIDs due to GI issues  She tried preventive  medication beta-blocker in the past was not helpful  She complains of difficulty sleeping, also complains of worsening urinary urgency, to the point of incontinence, chronic neck pain, cracking sound moving her neck, radiating pain to the right shoulder hyperreflexia on examination,   PHYSICAL EXAM:   Vitals:   05/21/22 1544  BP: 108/75  Pulse: 68  Height: 5' 3"$  (1.6 m)      Body mass index is 20.69 kg/m.  PHYSICAL EXAMNIATION:  Gen: NAD, conversant, well nourised, well groomed                     Cardiovascular: Regular rate rhythm, no peripheral edema, warm, nontender. Eyes: Conjunctivae clear without exudates or hemorrhage Neck: Supple, no carotid bruits. Pulmonary: Clear to auscultation bilaterally   NEUROLOGICAL EXAM:  MENTAL STATUS: Speech/cognition: Awake, alert, oriented to history taking and casual conversation CRANIAL NERVES: CN II: Visual fields are full to confrontation. Pupils are round equal and briskly reactive to light. CN III, IV, VI: extraocular movement are normal. No ptosis. CN V: Facial sensation is intact to light touch CN VII: Face is symmetric with normal eye closure  CN VIII: Hearing is normal to causal conversation. CN IX, X: Phonation is normal. CN XI: Head turning and shoulder shrug are intact  MOTOR: There is no pronator drift of out-stretched arms. Muscle bulk and tone are normal. Muscle strength is normal.  REFLEXES: Reflexes are 2+ and symmetric at the biceps, triceps, 3/3 knees, and ankles. Plantar responses are flexor.  SENSORY: Intact to light touch, pinprick and vibratory sensation are intact in fingers and toes.  COORDINATION: There is no trunk or  limb dysmetria noted.  GAIT/STANCE: Rely on her crutches, right foot antalgia  REVIEW OF SYSTEMS:  Full 14 system review of systems performed and notable only for as above All other review of systems were negative.   ALLERGIES: Allergies  Allergen Reactions   Codeine Nausea  And Vomiting   Erythromycin Hives   Nsaids Other (See Comments)    Abd pain    HOME MEDICATIONS: Current Outpatient Medications  Medication Sig Dispense Refill   acetaminophen (TYLENOL) 500 MG tablet Take 500 mg by mouth every 6 (six) hours as needed for mild pain or headache.     B Complex-C (SUPER B COMPLEX PO) Take by mouth.     carisoprodol (SOMA) 350 MG tablet Take 125 mg by mouth 4 (four) times daily as needed for muscle spasms.      Cholecalciferol (VITAMIN D3 PO) Take 10,000 Units by mouth daily.     clonazePAM (KLONOPIN) 0.5 MG tablet Take 0.25-0.5 mg by mouth as needed for anxiety (sleep).      COLLAGEN PO Take 1 tablet by mouth daily.     dicyclomine (BENTYL) 10 MG capsule TAKE 1 CAPSULE (10 MG TOTAL) BY MOUTH 3 (THREE) TIMES DAILY BEFORE MEALS. AS NEEDED FOR CRAMPING. 270 capsule 1   DIGESTIVE ENZYMES PO Take by mouth.     JUNEL FE 1/20 1-20 MG-MCG tablet TAKE 1 TABLET BY MOUTH EVERY DAY 84 tablet 1   loratadine (CLARITIN) 10 MG tablet Take 10 mg by mouth daily as needed.     Magnesium 250 MG TABS Take 250 mg by mouth daily.     ondansetron (ZOFRAN ODT) 4 MG disintegrating tablet Take 1 tablet (4 mg total) by mouth every 8 (eight) hours as needed for nausea or vomiting. 20 tablet 1   Probiotic Product (PROBIOTIC PO) Take 1 tablet by mouth daily.      promethazine (PHENERGAN) 25 MG tablet Take 25 mg by mouth every 6 (six) hours as needed for nausea or vomiting.     traMADol (ULTRAM) 50 MG tablet Take 50 mg by mouth 3 (three) times daily as needed for moderate pain.     TRINTELLIX 10 MG TABS tablet Take 20 mg by mouth at bedtime.     UNABLE TO FIND Med Name: calcium + k2     UNABLE TO FIND Take 1 tablet by mouth 2 (two) times daily. Med Name: GI advantage     vitamin C (ASCORBIC ACID) 500 MG tablet Take 1,000 mg by mouth daily.     No current facility-administered medications for this visit.    PAST MEDICAL HISTORY: Past Medical History:  Diagnosis Date   Abdominal cramps  11/07/2014   Arthritis    Back pain    Depressed    Headache associated with orgasm 11/07/2014   History of panic attacks 11/07/2014   Migraine    Missed periods 11/07/2014   Sinus infection 11/07/2014   Vaginal Pap smear, abnormal     PAST SURGICAL HISTORY: Past Surgical History:  Procedure Laterality Date   BIOPSY  08/10/2018   Procedure: BIOPSY;  Surgeon: Daneil Dolin, MD;  [facility];  Service: Endoscopy;;  colon   COLONOSCOPY WITH PROPOFOL N/A 08/10/2018   Procedure: COLONOSCOPY WITH PROPOFOL;  Surgeon: Daneil Dolin, MD;  Location: [facility];  Service: Endoscopy;  Laterality: N/A;  12:00pm   urethra strecthed      FAMILY HISTORY: Family History  Adopted: Yes  Problem Relation Age of Onset  Kidney disease Paternal Grandfather        was on dialysis   Other Paternal Grandmother        aneursym   Congestive Heart Failure Maternal Grandmother    Cancer Maternal Grandmother        breast   Congestive Heart Failure Maternal Grandfather    Bladder Cancer Maternal Grandfather    Alcoholism Father    Dementia Father    Kidney disease Mother        renal failure   Hodgkin's lymphoma Mother    Other Sister        drowned   Diabetes Sister        Type 1   Diabetes Sister        Type 1   Colon cancer Neg Hx    Inflammatory bowel disease Neg Hx     SOCIAL HISTORY: Social History   Socioeconomic History   Marital status: Divorced    Spouse name: Not on file   Number of children: 1   Years of education: Not on file   Highest education level: Not on file  Occupational History   Not on file  Tobacco Use   Smoking status: Former    Types: Cigarettes   Smokeless tobacco: Never  Vaping Use   Vaping Use: Former  Substance and Sexual Activity   Alcohol use: No   Drug use: No   Sexual activity: Not Currently    Birth control/protection: Pill  Other Topics Concern   Not on file  Social History Narrative   Not on file   Social Determinants of Health    Financial Resource Strain: Low Risk  (02/06/2021)   Overall Financial Resource Strain (CARDIA)    Difficulty of Paying Living Expenses: Not very hard  Food Insecurity: No Food Insecurity (02/06/2021)   Hunger Vital Sign    Worried About Running Out of Food in the Last Year: Never true    [place] in the Last Year: Never true  Transportation Needs: No Transportation Needs (02/06/2021)   PRAPARE - Hydrologist (Medical): No    Lack of Transportation (Non-Medical): No  Physical Activity: Insufficiently Active (02/06/2021)   Exercise Vital Sign    Days of Exercise per Week: 1 day    Minutes of Exercise per Session: 40 min  Stress: Stress Concern Present (02/06/2021)   [place]    Feeling of Stress : Very much  Social Connections: Moderately Integrated (02/06/2021)   Social Connection and Isolation Panel [NHANES]    Frequency of Communication with Friends and Family: More than three times a week    Frequency of Social Gatherings with Friends and Family: Once a week    Attends Religious Services: More than 4 times per year    Active Member of [employer] or Organizations: No    Attends Archivist Meetings: Never    Marital Status: Living with partner  Intimate Partner Violence: Not At Risk (02/06/2021)   Humiliation, Afraid, Rape, and Kick questionnaire    Fear of Current or Ex-Partner: No    Emotionally Abused: No    Physically Abused: No    Sexually Abused: No      Marcial Pacas, M.D. Ph.D.  [facility] [address] [place], [place] [address] Ph: [phone] Fax: [phone]  CC:  Celene Squibb, MD [place],  [place] [address]  Carly Baker,  Carly Areola, MD

## 2022-05-21 NOTE — Patient Instructions (Addendum)
Meds ordered this encounter  Medications   divalproex (DEPAKOTE ER) 500 MG 24 hr tablet---every night as migraine prevention.    Sig: Take 1 tablet (500 mg total) by mouth daily.    Dispense:  30 tablet    Refill:  11   rizatriptan (MAXALT-MLT) 10 MG disintegrating tablet    Sig: Take 1 tablet (10 mg total) by mouth as needed for migraine. May repeat in 2 hours if needed    Dispense:  9 tablet    Refill:  11   ondansetron (ZOFRAN) 4 MG tablet    Sig: Take 1 tablet (4 mg total) by mouth every 8 (eight) hours as needed for nausea or vomiting.    Dispense:  20 tablet    Refill:  6   tiZANidine (ZANAFLEX) 4 MG tablet    Sig: Take 1 tablet (4 mg total) by mouth every 6 (six) hours as needed for muscle spasms.    Dispense:  30 tablet    Refill:  6     Maxalt, may combine with zofran for nauseous, Tylenol, tizanidine as muscle relaxant,

## 2022-05-31 ENCOUNTER — Other Ambulatory Visit: Payer: Self-pay | Admitting: Adult Health

## 2022-05-31 MED ORDER — NORETHINDRONE 0.35 MG PO TABS: 1.0000 | ORAL_TABLET | Freq: Every day | ORAL | 11 refills | Status: DC

## 2022-05-31 NOTE — Progress Notes (Signed)
Stop Junel and start micronor

## 2022-06-04 ENCOUNTER — Ambulatory Visit
Admission: RE | Admit: 2022-06-04 | Discharge: 2022-06-04 | Disposition: A | Discharge status: Home / Self Care | Payer: Managed Care, Other (non HMO) | Source: Ambulatory Visit | Attending: Neurology | Admitting: Neurology

## 2022-06-04 ENCOUNTER — Encounter: Payer: Self-pay | Admitting: Neurology

## 2022-06-04 DIAGNOSIS — M542 Cervicalgia: Secondary | ICD-10-CM

## 2022-06-04 DIAGNOSIS — G43E09 Chronic migraine with aura, not intractable, without status migrainosus: Secondary | ICD-10-CM

## 2022-06-04 DIAGNOSIS — R269 Unspecified abnormalities of gait and mobility: Secondary | ICD-10-CM

## 2022-06-04 DIAGNOSIS — R292 Abnormal reflex: Secondary | ICD-10-CM

## 2022-08-02 ENCOUNTER — Other Ambulatory Visit: Payer: Self-pay | Admitting: Adult Health

## 2022-08-02 ENCOUNTER — Encounter (INDEPENDENT_AMBULATORY_CARE_PROVIDER_SITE_OTHER): Payer: Managed Care, Other (non HMO) | Admitting: Neurology

## 2022-08-02 DIAGNOSIS — G43E09 Chronic migraine with aura, not intractable, without status migrainosus: Secondary | ICD-10-CM

## 2022-08-02 MED ORDER — BUTALBITAL-APAP-CAFFEINE 50-325-40 MG PO TABS: 1.0000 | ORAL_TABLET | Freq: Four times a day (QID) | ORAL | 0 refills | Status: DC | PRN

## 2022-08-02 NOTE — Progress Notes (Signed)
I will send in rx for fioricet but do not take with Zanaflex

## 2022-08-05 ENCOUNTER — Other Ambulatory Visit: Payer: Self-pay | Admitting: Adult Health

## 2022-08-05 MED ORDER — BUTALBITAL-APAP-CAFFEINE 50-325-40 MG PO TABS: 1.0000 | ORAL_TABLET | Freq: Four times a day (QID) | ORAL | 0 refills | Status: DC | PRN

## 2022-08-05 NOTE — Progress Notes (Signed)
Sent rx to [facility] for Fioricet

## 2022-08-06 ENCOUNTER — Other Ambulatory Visit: Payer: Self-pay | Admitting: Neurology

## 2022-08-06 MED ORDER — UBRELVY 50 MG PO TABS: ORAL_TABLET | ORAL | 11 refills | Status: DC

## 2022-08-06 MED ORDER — AIMOVIG 70 MG/ML ~~LOC~~ SOAJ: 70.0000 mg | SUBCUTANEOUS | 11 refills | Status: DC

## 2022-08-06 MED ORDER — ZOLMITRIPTAN 5 MG PO TBDP: 5.0000 mg | ORAL_TABLET | ORAL | 6 refills | Status: DC | PRN

## 2022-08-06 NOTE — Telephone Encounter (Addendum)
Please see the MyChart message reply(ies) for my assessment and plan.    This patient gave consent for this Medical Advice Message and is aware that it may result in a bill to [employer], as well as the possibility of receiving a bill for a co-payment or deductible. They are an established patient, but are not seeking medical advice exclusively about a problem treated during an in person or video visit in the last seven days. I did not recommend an in person or video visit within seven days of my reply.    I spent a total of 18 minutes cumulative time within 7 days through [employer].  Levert Feinstein, MD

## 2022-08-06 NOTE — Addendum Note (Signed)
Addended by: Levert Feinstein on: 08/06/2022 12:16 PM   Modules accepted: Orders

## 2022-08-07 ENCOUNTER — Other Ambulatory Visit: Payer: Managed Care, Other (non HMO)

## 2022-08-07 ENCOUNTER — Other Ambulatory Visit: Payer: Self-pay | Admitting: Adult Health

## 2022-08-07 ENCOUNTER — Other Ambulatory Visit (HOSPITAL_COMMUNITY)
Admission: RE | Admit: 2022-08-07 | Discharge: 2022-08-07 | Disposition: A | Discharge status: Home / Self Care | Payer: Managed Care, Other (non HMO) | Source: Ambulatory Visit | Attending: Obstetrics & Gynecology | Admitting: Obstetrics & Gynecology

## 2022-08-07 ENCOUNTER — Other Ambulatory Visit (HOSPITAL_COMMUNITY): Payer: Self-pay

## 2022-08-07 DIAGNOSIS — R8290 Unspecified abnormal findings in urine: Secondary | ICD-10-CM | POA: Diagnosis not present

## 2022-08-07 DIAGNOSIS — N898 Other specified noninflammatory disorders of vagina: Secondary | ICD-10-CM | POA: Diagnosis not present

## 2022-08-07 DIAGNOSIS — R300 Dysuria: Secondary | ICD-10-CM | POA: Diagnosis not present

## 2022-08-07 LAB — POCT URINALYSIS DIPSTICK OB
Glucose, UA: NEGATIVE
Ketones, UA: NEGATIVE
Nitrite, UA: NEGATIVE
POC,PROTEIN,UA: NEGATIVE

## 2022-08-07 MED ORDER — FLUCONAZOLE 150 MG PO TABS: ORAL_TABLET | ORAL | 1 refills | Status: DC

## 2022-08-07 MED ORDER — SULFAMETHOXAZOLE-TRIMETHOPRIM 800-160 MG PO TABS: 1.0000 | ORAL_TABLET | Freq: Two times a day (BID) | ORAL | 0 refills | Status: DC

## 2022-08-07 NOTE — Telephone Encounter (Signed)
Emgality and Ajovy are preferred and covered at this time. Loading dose for Emgality is also covered, please advise if patient has had previous trial or send in new prescriptions if appropriate.

## 2022-08-07 NOTE — Progress Notes (Signed)
Rx septra ds and diflucan sent

## 2022-08-07 NOTE — Progress Notes (Addendum)
   NURSE VISIT- UTI SYMPTOMS   SUBJECTIVE:  Carly Baker is a 52 y.o. G12P1001 female here for UTI symptoms. She is a GYN patient. She reports cloudy urine, dysuria, and urinary urgency. Also request vaginal swab for vaginal discharge.  OBJECTIVE:  There were no vitals taken for this visit.  Appears well, in no apparent distress  Results for orders placed or performed in visit on 08/07/22 (from the past 24 hour(s))  POC Urinalysis Dipstick OB   Collection Time: 08/07/22  3:53 PM  Result Value Ref Range   Color, UA     Clarity, UA     Glucose, UA Negative Negative   Bilirubin, UA     Ketones, UA neg    Spec Grav, UA     Blood, UA trace    pH, UA     POC,PROTEIN,UA Negative Negative, Trace, Small (1+), Moderate (2+), Large (3+), 4+   Urobilinogen, UA     Nitrite, UA neg    Leukocytes, UA Small (1+) (A) Negative   Appearance     Odor      ASSESSMENT: GYN patient with UTI symptoms and negative nitrites  PLAN: Discussed with Cyril Mourning, AGNP   Rx sent by provider today: Yes. Pt also requesting Diflucan since she will be taking antibiotics to prevent yeast infection Urine culture sent Call or return to clinic prn if these symptoms worsen or fail to improve as anticipated. Follow-up: as needed   Jobe Marker  08/07/2022 3:55 PM

## 2022-08-08 LAB — MICROSCOPIC EXAMINATION
Bacteria, UA: NONE SEEN
Casts: NONE SEEN /lpf
Epithelial Cells (non renal): NONE SEEN /hpf (ref 0–10)
RBC, Urine: NONE SEEN /hpf (ref 0–2)

## 2022-08-08 LAB — URINALYSIS, ROUTINE W REFLEX MICROSCOPIC
Bilirubin, UA: NEGATIVE
Glucose, UA: NEGATIVE
Ketones, UA: NEGATIVE
Nitrite, UA: NEGATIVE
Protein,UA: NEGATIVE
RBC, UA: NEGATIVE
Specific Gravity, UA: 1.006 (ref 1.005–1.030)
Urobilinogen, Ur: 0.2 mg/dL (ref 0.2–1.0)
pH, UA: 7 (ref 5.0–7.5)

## 2022-08-09 LAB — CERVICOVAGINAL ANCILLARY ONLY
Bacterial Vaginitis (gardnerella): POSITIVE — AB
Candida Glabrata: POSITIVE — AB
Candida Vaginitis: NEGATIVE
Chlamydia: NEGATIVE
Comment: NEGATIVE
Comment: NEGATIVE
Comment: NEGATIVE
Comment: NEGATIVE
Comment: NEGATIVE
Comment: NORMAL
Neisseria Gonorrhea: NEGATIVE
Trichomonas: NEGATIVE

## 2022-08-09 LAB — URINE CULTURE: Organism ID, Bacteria: NO GROWTH

## 2022-08-12 ENCOUNTER — Other Ambulatory Visit: Payer: Self-pay | Admitting: Adult Health

## 2022-08-12 MED ORDER — METRONIDAZOLE 500 MG PO TABS: 500.0000 mg | ORAL_TABLET | Freq: Two times a day (BID) | ORAL | 0 refills | Status: DC

## 2022-08-12 MED ORDER — FLUCONAZOLE 150 MG PO TABS: ORAL_TABLET | ORAL | 1 refills | Status: DC

## 2022-08-12 MED ORDER — METRONIDAZOLE 0.75 % VA GEL: 1.0000 | Freq: Every day | VAGINAL | 1 refills | Status: DC

## 2022-08-12 NOTE — Progress Notes (Signed)
Rx metrogel  

## 2022-08-12 NOTE — Progress Notes (Signed)
+  Yeast and BV on vaginal swab will rx flagyl and diflucan,no sex or alcohol while taking   

## 2022-08-12 NOTE — Telephone Encounter (Signed)
  Looks like ajovy will still need PA but pharmacy states insurance denied the aimovig, so we may need to change to ajovy and do PA if you are agreeable

## 2022-08-16 ENCOUNTER — Other Ambulatory Visit (HOSPITAL_COMMUNITY): Payer: Self-pay

## 2022-08-31 ENCOUNTER — Telehealth: Payer: Self-pay | Admitting: Pharmacy Technician

## 2022-08-31 NOTE — Telephone Encounter (Signed)
Patient Advocate Encounter  Prior Authorization for Bernita Raisin 50MG  tablets has been approved.    PA# [id number] Key: [employer] Electronic PA Form Effective dates: 08/31/2022 through 08/31/2023

## 2022-10-14 ENCOUNTER — Ambulatory Visit: Payer: Managed Care, Other (non HMO) | Admitting: Adult Health

## 2022-11-12 ENCOUNTER — Other Ambulatory Visit: Payer: Self-pay | Admitting: Adult Health

## 2022-11-14 ENCOUNTER — Other Ambulatory Visit: Payer: Self-pay | Admitting: Adult Health

## 2022-11-14 MED ORDER — BUTALBITAL-APAP-CAFFEINE 50-325-40 MG PO TABS: 1.0000 | ORAL_TABLET | Freq: Four times a day (QID) | ORAL | 0 refills | Status: DC | PRN

## 2022-11-14 NOTE — Progress Notes (Signed)
Refill Fioricet. 

## 2023-01-07 ENCOUNTER — Ambulatory Visit: Payer: Managed Care, Other (non HMO) | Admitting: Adult Health

## 2023-01-13 ENCOUNTER — Ambulatory Visit (INDEPENDENT_AMBULATORY_CARE_PROVIDER_SITE_OTHER): Payer: Managed Care, Other (non HMO) | Admitting: Adult Health

## 2023-01-13 ENCOUNTER — Encounter: Payer: Self-pay | Admitting: Adult Health

## 2023-01-13 VITALS — BP 114/59 | HR 73 | Ht 63.0 in | Wt 115.5 lb

## 2023-01-13 DIAGNOSIS — G43E09 Chronic migraine with aura, not intractable, without status migrainosus: Secondary | ICD-10-CM

## 2023-01-13 MED ORDER — BUTALBITAL-APAP-CAFFEINE 50-325-40 MG PO TABS: 1.0000 | ORAL_TABLET | Freq: Four times a day (QID) | ORAL | 1 refills | Status: AC | PRN

## 2023-01-13 NOTE — Progress Notes (Signed)
  Subjective:     Patient ID: Carly Baker, female   DOB: 1970-10-16, 52 y.o.   MRN: [id number]  HPI Carly is a 52 year old white female, divorced, G1P1001 in for follow up on taking Fioricet for migraines, has seen neurologist but can't take the meds prescribed, makes her too sleepy to work and are expensive. She says will take 1/2 at times and that helps too.  She has body aches and sees DR Margo Aye and an orthopedist.     Component Value Date/Time   DIAGPAP  02/06/2021 1456    - Negative for intraepithelial lesion or malignancy (NILM)   DIAGPAP  10/29/2017 0000    NEGATIVE FOR INTRAEPITHELIAL LESIONS OR MALIGNANCY.   HPVHIGH Negative 02/06/2021 1456   ADEQPAP  02/06/2021 1456    Satisfactory for evaluation; transformation zone component PRESENT.   ADEQPAP  10/29/2017 0000    Satisfactory for evaluation  endocervical/transformation zone component PRESENT.    PCP is Dr Margo Aye   Review of Systems Has migraines with aura Has had recent cluster headache +body aches Spots occasionally on Micronor   Reviewed past medical,surgical, social and family history. Reviewed medications and allergies.  Objective:   Physical Exam BP (!) 114/59 (BP Location: Left Arm, Patient Position: Sitting, Cuff Size: Normal)   Pulse 73   Ht 5\' 3"  (1.6 m)   Wt 115 lb 8 oz (52.4 kg)   LMP 12/26/2022 (Approximate)   BMI 20.46 kg/m     Skin warm and dry. Lungs: clear to ausculation bilaterally. Cardiovascular: regular rate and rhythm.  Fall risk is moderate  Upstream - 01/13/23 1521       Pregnancy Intention Screening   Does the patient want to become pregnant in the next year? No    Does the patient's partner want to become pregnant in the next year? No    Would the patient like to discuss contraceptive options today? No      Contraception Wrap Up   Current Method Oral Contraceptive    End Method Oral Contraceptive    Contraception Counseling Provided Yes             Assessment:     1.  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 Do not take with zanaflex    Dispense:  20 tablet    Refill:  1    Order Specific Question:   Supervising Provider    Answer:   Lazaro Arms [[id number]]       Plan:     Follow up in 6 months or sooner if needed

## 2023-05-17 ENCOUNTER — Other Ambulatory Visit: Payer: Self-pay | Admitting: Adult Health

## 2023-06-26 ENCOUNTER — Encounter: Payer: Self-pay | Admitting: *Deleted

## 2023-09-01 ENCOUNTER — Telehealth: Payer: Self-pay

## 2023-09-01 NOTE — Telephone Encounter (Signed)
 Pharmacy Patient Advocate Encounter   Received notification from CoverMyMeds that prior authorization for Ubrelvy  50MG  tablets is due for renewal.   Insurance verification completed.   The patient is insured through [facility].  Action: Medication has been discontinued. Archived Key: [id number]

## 2024-02-17 ENCOUNTER — Ambulatory Visit (INDEPENDENT_AMBULATORY_CARE_PROVIDER_SITE_OTHER): Admitting: Adult Health

## 2024-02-17 ENCOUNTER — Encounter: Payer: Self-pay | Admitting: Adult Health

## 2024-02-17 ENCOUNTER — Other Ambulatory Visit (HOSPITAL_COMMUNITY)
Admission: RE | Admit: 2024-02-17 | Discharge: 2024-02-17 | Disposition: A | Discharge status: Home / Self Care | Source: Ambulatory Visit | Attending: Obstetrics & Gynecology | Admitting: Obstetrics & Gynecology

## 2024-02-17 VITALS — BP 127/82 | HR 69 | Ht 63.0 in | Wt 119.0 lb

## 2024-02-17 DIAGNOSIS — R52 Pain, unspecified: Secondary | ICD-10-CM

## 2024-02-17 DIAGNOSIS — Z01419 Encounter for gynecological examination (general) (routine) without abnormal findings: Secondary | ICD-10-CM | POA: Diagnosis present

## 2024-02-17 DIAGNOSIS — N951 Menopausal and female climacteric states: Secondary | ICD-10-CM | POA: Insufficient documentation

## 2024-02-17 DIAGNOSIS — G43E09 Chronic migraine with aura, not intractable, without status migrainosus: Secondary | ICD-10-CM

## 2024-02-17 DIAGNOSIS — N926 Irregular menstruation, unspecified: Secondary | ICD-10-CM | POA: Diagnosis not present

## 2024-02-17 DIAGNOSIS — R350 Frequency of micturition: Secondary | ICD-10-CM

## 2024-02-17 DIAGNOSIS — Z3202 Encounter for pregnancy test, result negative: Secondary | ICD-10-CM | POA: Diagnosis not present

## 2024-02-17 DIAGNOSIS — R030 Elevated blood-pressure reading, without diagnosis of hypertension: Secondary | ICD-10-CM

## 2024-02-17 DIAGNOSIS — N959 Unspecified menopausal and perimenopausal disorder: Secondary | ICD-10-CM | POA: Diagnosis not present

## 2024-02-17 DIAGNOSIS — R232 Flushing: Secondary | ICD-10-CM

## 2024-02-17 LAB — POCT URINALYSIS DIPSTICK
Blood, UA: NEGATIVE
Glucose, UA: NEGATIVE
Ketones, UA: NEGATIVE
Leukocytes, UA: NEGATIVE
Nitrite, UA: NEGATIVE
Protein, UA: NEGATIVE

## 2024-02-17 LAB — POCT URINE PREGNANCY: Preg Test, Ur: NEGATIVE

## 2024-02-17 NOTE — Progress Notes (Signed)
 Patient ID: Carly Baker, female   DOB: 1970/04/02, 53 y.o.   MRN: [id number] History of Present Illness: Carly Baker is a 53 year old white female, divorced, G1P1001, in for a well woman gyn exam and pap. She has complaints of urinary frequency for about a week, irregular periods, will skip 3-4 months then have one, has hot flashes, night sweats, fatigue, body aches feels bloated at times has chronic migraines and is having foot surgery in near future.  She is moody too.  PCP is Dr Carly Baker   Current Medications, Allergies, Past Medical History, Past Surgical History, Family History and Social History were reviewed in [employer] electronic medical record.     Review of Systems: Patient denies any hearing change, blurred vision, shortness of breath, chest pain, abdominal pain, problems with bowel movements,  or intercourse. See HPI for positives   Physical Exam:BP 127/82 (BP Location: Left Arm, Patient Position: Sitting, Cuff Size: Normal)   Pulse 69   Ht 5' 3 (1.6 m)   Wt 119 lb (54 kg)   LMP 11/17/2023 (Exact Date)   BMI 21.08 kg/m  urine dipstick was negative, UPT was negative  General:  Well developed, well nourished, no acute distress Skin:  Warm and dry Neck:  Midline trachea, normal thyroid, good ROM, no lymphadenopathy Lungs; Clear to auscultation bilaterally Breast:  No dominant palpable mass, retraction, or nipple discharge Cardiovascular: Regular rate and rhythm Abdomen:  Soft, non tender, no hepatosplenomegaly Pelvic:  External genitalia is normal in appearance, no lesions.  The vagina is normal in appearance. Urethra has no lesions or masses. The cervix is smooth with stenotic os, pap with HR HPV genotyping performed.  Uterus is felt to be normal size, shape, and contour.  No adnexal masses or tenderness noted.Bladder is non tender, no masses felt. Rectal: Deferred  Extremities/musculoskeletal:  No swelling or varicosities noted, no clubbing or cyanosis Psych:  No mood  changes, alert and cooperative,seems happy AA is 2 Fall risk is low    02/17/2024    3:34 PM 02/06/2021    2:44 PM 12/24/2017    1:35 PM  Depression screen PHQ 2/9  Decreased Interest 1 3 1   Down, Depressed, Hopeless 2 3 0  PHQ - 2 Score 3 6 1   Altered sleeping 2 3   Tired, decreased energy 3 3   Change in appetite 2 3   Feeling bad or failure about yourself  0 3   Trouble concentrating 3 3   Moving slowly or fidgety/restless 0 0   Suicidal thoughts 0 0   PHQ-9 Score 13 21       Data saved with a previous flowsheet row definition    She is on meds    02/17/2024    3:34 PM 02/06/2021    2:45 PM  GAD 7 : Generalized Anxiety Score  Nervous, Anxious, on Edge 2 3  Control/stop worrying 2 3  Worry too much - different things 2 3  Trouble relaxing 2 3  Restless 0 0  Easily annoyed or irritable 2 3  Afraid - awful might happen 2 3  Total GAD 7 Score 12 18      Upstream - 02/17/24 1600       Pregnancy Intention Screening   Does the patient want to become pregnant in the next year? No    Does the patient's partner want to become pregnant in the next year? No    Would the patient like to discuss contraceptive options today?  No      Contraception Wrap Up   Current Method Withdrawal or Other Method    End Method Withdrawal or Other Method         Examination chaperoned by Clarita Salt LPN   Impression and plan: 1. Negative pregnancy test - POCT urine pregnancy  2. Urinary frequency Declines UA C&S to rule out UTI - POCT Urinalysis Dipstick  3. Encounter for gynecological examination with Papanicolaou smear of cervix (Primary) Pap sent  Pap in 3 years if negative Physical in 1 year Labs with PCP were normal she says Colonoscopy per GI Declines mammogram for now  - Cytology - PAP( [place])  4. Elevated BP without diagnosis of hypertension BP was good on recheck, follow up with PCP  5. Chronic migraine with aura without status migrainosus, not  intractable Has seen [facility] did not care for MD seen,   6. Hot flashes   7. Irregular periods Will skip periods, for 3-4 months   8. Body aches  9. Perimenopause Review handout on perimenopause and menopause

## 2024-02-23 LAB — CYTOLOGY - PAP
Adequacy: ABSENT
Comment: NEGATIVE
Comment: NEGATIVE
Comment: NEGATIVE
Diagnosis: UNDETERMINED — AB
HPV 16: NEGATIVE
HPV 18 / 45: NEGATIVE
High risk HPV: POSITIVE — AB

## 2024-02-24 ENCOUNTER — Ambulatory Visit: Payer: Self-pay | Admitting: Adult Health

## 2024-02-24 DIAGNOSIS — R87610 Atypical squamous cells of undetermined significance on cytologic smear of cervix (ASC-US): Secondary | ICD-10-CM | POA: Insufficient documentation
